# Patient Record
Sex: Female | Born: 1990
Health system: Southern US, Community
[De-identification: ages and names within clinical notes are randomized; demographics above are authoritative.]

## PROBLEM LIST (undated history)

## (undated) ENCOUNTER — Inpatient Hospital Stay (HOSPITAL_COMMUNITY): Payer: Self-pay

## (undated) DIAGNOSIS — I2699 Other pulmonary embolism without acute cor pulmonale: Secondary | ICD-10-CM

## (undated) DIAGNOSIS — Z789 Other specified health status: Secondary | ICD-10-CM

## (undated) HISTORY — PX: EYE SURGERY: SHX253

---
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
## 2012-11-19 DIAGNOSIS — H527 Unspecified disorder of refraction: Secondary | ICD-10-CM | POA: Insufficient documentation

## 2012-11-19 DIAGNOSIS — H5015 Alternating exotropia: Secondary | ICD-10-CM | POA: Insufficient documentation

## 2013-02-27 DIAGNOSIS — H53003 Unspecified amblyopia, bilateral: Secondary | ICD-10-CM | POA: Insufficient documentation

## 2013-10-10 ENCOUNTER — Emergency Department (HOSPITAL_BASED_OUTPATIENT_CLINIC_OR_DEPARTMENT_OTHER)
Admission: EM | Admit: 2013-10-10 | Discharge: 2013-10-10 | Disposition: A | Discharge status: Home / Self Care | Payer: BC Managed Care – PPO | Attending: Emergency Medicine | Admitting: Emergency Medicine

## 2013-10-10 ENCOUNTER — Encounter (HOSPITAL_BASED_OUTPATIENT_CLINIC_OR_DEPARTMENT_OTHER): Payer: Self-pay | Admitting: Emergency Medicine

## 2013-10-10 DIAGNOSIS — J029 Acute pharyngitis, unspecified: Secondary | ICD-10-CM | POA: Insufficient documentation

## 2013-10-10 DIAGNOSIS — R112 Nausea with vomiting, unspecified: Secondary | ICD-10-CM | POA: Diagnosis not present

## 2013-10-10 LAB — RAPID STREP SCREEN (MED CTR MEBANE ONLY): STREPTOCOCCUS, GROUP A SCREEN (DIRECT): NEGATIVE

## 2013-10-10 MED ORDER — ONDANSETRON HCL 4 MG/2ML IJ SOLN
4.0000 mg | Freq: Once | INTRAMUSCULAR | Status: AC
  Administered 2013-10-10: 4 mg via INTRAVENOUS
  Filled 2013-10-10: qty 2

## 2013-10-10 MED ORDER — OXYCODONE-ACETAMINOPHEN 5-325 MG PO TABS
1.0000 | ORAL_TABLET | Freq: Once | ORAL | Status: AC
  Administered 2013-10-10: 1 via ORAL
  Filled 2013-10-10: qty 1

## 2013-10-10 MED ORDER — DEXAMETHASONE 4 MG PO TABS: 10.0000 mg | ORAL_TABLET | Freq: Once | ORAL | Status: DC

## 2013-10-10 MED ORDER — DEXAMETHASONE 4 MG PO TABS
ORAL_TABLET | ORAL | Status: AC
  Filled 2013-10-10: qty 3

## 2013-10-10 MED ORDER — SODIUM CHLORIDE 0.9 % IV BOLUS (SEPSIS)
1000.0000 mL | Freq: Once | INTRAVENOUS | Status: AC
  Administered 2013-10-10: 1000 mL via INTRAVENOUS

## 2013-10-10 MED ORDER — DEXAMETHASONE 4 MG PO TABS
10.0000 mg | ORAL_TABLET | Freq: Once | ORAL | Status: AC
  Administered 2013-10-10: 10 mg via ORAL

## 2013-10-10 NOTE — Discharge Instructions (Signed)
Upper Respiratory Infection, Adult °An upper respiratory infection (URI) is also sometimes known as the common cold. The upper respiratory tract includes the nose, sinuses, throat, trachea, and bronchi. Bronchi are the airways leading to the lungs. Most people improve within 1 week, but symptoms can last up to 2 weeks. A residual cough may last even longer.  °CAUSES °Many different viruses can infect the tissues lining the upper respiratory tract. The tissues become irritated and inflamed and often become very moist. Mucus production is also common. A cold is contagious. You can easily spread the virus to others by oral contact. This includes kissing, sharing a glass, coughing, or sneezing. Touching your mouth or nose and then touching a surface, which is then touched by another person, can also spread the virus. °SYMPTOMS  °Symptoms typically develop 1 to 3 days after you come in contact with a cold virus. Symptoms vary from person to person. They may include: °· Runny nose. °· Sneezing. °· Nasal congestion. °· Sinus irritation. °· Sore throat. °· Loss of voice (laryngitis). °· Cough. °· Fatigue. °· Muscle aches. °· Loss of appetite. °· Headache. °· Low-grade fever. °DIAGNOSIS  °You might diagnose your own cold based on familiar symptoms, since most people get a cold 2 to 3 times a year. Your caregiver can confirm this based on your exam. Most importantly, your caregiver can check that your symptoms are not due to another disease such as strep throat, sinusitis, pneumonia, asthma, or epiglottitis. Blood tests, throat tests, and X-rays are not necessary to diagnose a common cold, but they may sometimes be helpful in excluding other more serious diseases. Your caregiver will decide if any further tests are required. °RISKS AND COMPLICATIONS  °You may be at risk for a more severe case of the common cold if you smoke cigarettes, have chronic heart disease (such as heart failure) or lung disease (such as asthma), or if  you have a weakened immune system. The very young and very old are also at risk for more serious infections. Bacterial sinusitis, middle ear infections, and bacterial pneumonia can complicate the common cold. The common cold can worsen asthma and chronic obstructive pulmonary disease (COPD). Sometimes, these complications can require emergency medical care and may be life-threatening. °PREVENTION  °The best way to protect against getting a cold is to practice good hygiene. Avoid oral or hand contact with people with cold symptoms. Wash your hands often if contact occurs. There is no clear evidence that vitamin C, vitamin E, echinacea, or exercise reduces the chance of developing a cold. However, it is always recommended to get plenty of rest and practice good nutrition. °TREATMENT  °Treatment is directed at relieving symptoms. There is no cure. Antibiotics are not effective, because the infection is caused by a virus, not by bacteria. Treatment may include: °· Increased fluid intake. Sports drinks offer valuable electrolytes, sugars, and fluids. °· Breathing heated mist or steam (vaporizer or shower). °· Eating chicken soup or other clear broths, and maintaining good nutrition. °· Getting plenty of rest. °· Using gargles or lozenges for comfort. °· Controlling fevers with ibuprofen or acetaminophen as directed by your caregiver. °· Increasing usage of your inhaler if you have asthma. °Zinc gel and zinc lozenges, taken in the first 24 hours of the common cold, can shorten the duration and lessen the severity of symptoms. Pain medicines may help with fever, muscle aches, and throat pain. A variety of non-prescription medicines are available to treat congestion and runny nose. Your caregiver   can make recommendations and may suggest nasal or lung inhalers for other symptoms.  °HOME CARE INSTRUCTIONS  °· Only take over-the-counter or prescription medicines for pain, discomfort, or fever as directed by your  caregiver. °· Use a warm mist humidifier or inhale steam from a shower to increase air moisture. This may keep secretions moist and make it easier to breathe. °· Drink enough water and fluids to keep your urine clear or pale yellow. °· Rest as needed. °· Return to work when your temperature has returned to normal or as your caregiver advises. You may need to stay home longer to avoid infecting others. You can also use a face mask and careful hand washing to prevent spread of the virus. °SEEK MEDICAL CARE IF:  °· After the first few days, you feel you are getting worse rather than better. °· You need your caregiver's advice about medicines to control symptoms. °· You develop chills, worsening shortness of breath, or brown or red sputum. These may be signs of pneumonia. °· You develop yellow or brown nasal discharge or pain in the face, especially when you bend forward. These may be signs of sinusitis. °· You develop a fever, swollen neck glands, pain with swallowing, or white areas in the back of your throat. These may be signs of strep throat. °SEEK IMMEDIATE MEDICAL CARE IF:  °· You have a fever. °· You develop severe or persistent headache, ear pain, sinus pain, or chest pain. °· You develop wheezing, a prolonged cough, cough up blood, or have a change in your usual mucus (if you have chronic lung disease). °· You develop sore muscles or a stiff neck. °Document Released: 08/02/2000 Document Revised: 05/01/2011 Document Reviewed: 05/14/2013 °ExitCare® Patient Information ©2015 [employer]. This information is not intended to replace advice given to you by your health care provider. Make sure you discuss any questions you have with your health care provider. ° °Emergency Department Resource Guide °1) Find a Doctor and Pay Out of Pocket °Although you won't have to find out who is covered by your insurance plan, it is a good idea to ask around and get recommendations. You will then need to call the office and see if  the doctor you have chosen will accept you as a new patient and what types of options they offer for patients who are self-pay. Some doctors offer discounts or will set up payment plans for their patients who do not have insurance, but you will need to ask so you aren't surprised when you get to your appointment. ° °2) Contact Your Local Health Department °Not all health departments have doctors that can see patients for sick visits, but many do, so it is worth a call to see if yours does. If you don't know where your local health department is, you can check in your phone book. The CDC also has a tool to help you locate your state's health department, and many state websites also have listings of all of their local health departments. ° °3) Find a Walk-in Clinic °If your illness is not likely to be very severe or complicated, you may want to try a walk in clinic. These are popping up all over the country in pharmacies, drugstores, and shopping centers. They're usually staffed by nurse practitioners or physician assistants that have been trained to treat common illnesses and complaints. They're usually fairly quick and inexpensive. However, if you have serious medical issues or chronic medical problems, these are probably not your best option. ° °  No Primary Care Doctor: °- Call [facility] at  832-8000 - they can help you locate a primary care doctor that  accepts your insurance, provides certain services, etc. °- Physician Referral Service- 1-800-533-3463 ° °Chronic Pain Problems: °Organization         Address  Phone   Notes  °[facility]  (336) 297-2271 Patients need to be referred by their primary care doctor.  ° °Medication Assistance: °Organization         Address  Phone   Notes  °[place] [facility] [address] °[place], [place] [address] (336) 641-8030 --Must be a resident of [place] °-- Must have NO insurance coverage whatsoever (no  Medicaid/ Medicare, etc.) °-- The pt. MUST have a primary care doctor that directs their care regularly and follows them in the community °  °[facility]  (866) 331-1348   °[employer]  (888) 892-1162   ° °Agencies that provide inexpensive medical care: °Organization         Address  Phone   Notes  °[facility]  (336) 832-8035   °[facility]    (336) 832-7272   °[facility] [address] °[place], [place] [address] (336) 832-4777   °[facility] [address], °[place] (336) 271-4999   °[facility]    (336) 373-0678   °[facility]    (336) 272-1050   °[facility] ° [address], [place] Phone:  (336) 832-4444, Fax:  (336) 832-4440 Hours of Operation:  9 am - 6 pm, M-F.  Also accepts Medicaid/Medicare and self-pay.  °[facility] ° [address], [place] Phone: (336) 832-3150, Fax: (336) 832-3151. Hours of Operation:  8:30 am - 5:30 pm, M-F.  Also accepts Medicaid and self-pay.  °[facility] [place] [address], [place] Phone: (336) 878-6027   °[facility] [address], [place], [place] (336)723-1848, Ext. 123 Mondays & Thursdays: 7-9 AM.  First 15 patients are seen on a first come, first serve basis. °  ° °Medicaid-accepting [place] Providers: ° °Organization         Address  Phone   Notes  °[facility] [address], [place] (336) 641-2100 Also accepts self-pay patients.  °[facility] [address], [place] ° (336) 856-9996   °[facility] [address], [place] (336) 288-8857   °[facility] [address] [place] [address], [place] (336) 299-7000   °Veita Bland [address], [place]  ° (336) 373-1557 Only accepts [place] Access Medicaid patients after they have their name applied to their card.   ° °Self-Pay (no insurance) in [place]: ° °Organization         Address  Phone   Notes  °Sickle Cell Patients, [facility] [address], [place] (336) 832-1970   °[facility] [address], [place] (336) 832-4400   ° [facility] ° [address], [place] (336) 992-4800   °[facility]/Dr. Osei-Bonsu ° [address] [place] [address], [place] or [address], [place] (336) 841-8500 Phone number for both [place] and [place] locations is the same.  °[facility] [address], [place] (336) 299-0000   °  [facility] [address] [place] [address], [place] or [address] (336) 852-7530 °(336) 878-2260   °[facility] [address], [place] (336) 350-1642, phone; (336) 294-5005, fax Sees patients 1st and 3rd Saturday of every month.  Must not qualify for public or private insurance (i.e. Medicaid, Medicare, [place] Health Choice, Veterans' Benefits) • Household income should be no more than 200% of the poverty level •The clinic cannot treat you if you are pregnant or think you are pregnant • Sexually transmitted diseases are not treated at the clinic.  ° ° °Dental Care: °Organization         Address  Phone  Notes  °[facility] [address], [place] (336) 641-6152 Accepts children up to age 21 who are enrolled in Medicaid or [place] Health Choice; pregnant women with a Medicaid card; and children who have applied for Medicaid or [place] Health Choice, but were declined, whose parents can pay a reduced fee at time of service.  °[facility] [place]  [address], [place] (336) 641-7733 Accepts children up to age 21 who are enrolled in Medicaid or [place] Health Choice; pregnant women with a Medicaid card; and children who have applied for Medicaid or [place] Health Choice,  but were declined, whose parents can pay a reduced fee at time of service.  °[facility] ° [address], [place] (336) 641-4533 Patients are seen by appointment only. Walk-ins are not accepted. [facility] will see patients 18 years of age and older. °Monday - Tuesday (8am-5pm) °Most Wednesdays (8:30-5pm) °$30 per visit, cash only  °[facility] ° [address], [place] (336) 641-4533 Patients are seen by appointment only. Walk-ins are not accepted. [facility] will see patients 18 years of age and older. °One Wednesday Evening (Monthly: Volunteer Based).  $30 per visit, cash only  °[facility]  (919) 537-3737 for adults; Children under age 4, call Graduate Pediatric Dentistry at (919) [phone]. Children aged 4-14, please call (919) [phone] to request a pediatric application. ° Dental services are provided in all areas of dental care including fillings, crowns and bridges, complete and partial dentures, implants, gum treatment, root canals, and extractions. Preventive care is also provided. Treatment is provided to both adults and children. °Patients are selected via a lottery and there is often a waiting list. °  °[facility] [address], °[place] ° (336) 763-8833 [email or web address] °  °[facility] [address], [place], [place] (336)723-1848, Ext. 123 Second and Fourth Thursday of each month, opens at 6:30 AM; Clinic ends at 9 AM.  Patients are seen on a first-come first-served basis, and a limited number are seen during each clinic.  ° °[facility] ° [address], [place],  (336) 723-7904   Eligibility Requirements °You must have lived in [place], [place], or [place] counties for at least the last three months. °  You cannot be eligible for state or federal sponsored healthcare insurance, including Veterans Administration, Medicaid, or Medicare. °  You generally  cannot be eligible for healthcare insurance through your employer.  °  How to apply: °Eligibility screenings are held every Tuesday and Wednesday afternoon from 1:00 pm until 4:00 pm. You do not need an appointment for the interview!  °[facility] [address], [place],  336-631-2330   °  [facility] [facility]  336-342-8273   °[facility]  336-703-3100   °[facility]  336-570-6415   ° °Behavioral Health Resources in the Community: °Intensive Outpatient Programs °Organization         Address  Phone  Notes  °[facility] [address], [place], [place] 336-878-6098   °[facility] [address], [place], [place] 336-832-9800   °[facility] [address], [place], [place] ° 336-882-2125   °[facility] [address],  °[place], [place] 1-800-853-5163 or 336-641-4981   °Substance Abuse Resources °Organization         Address  Phone  Notes  °[facility]  336-882-2125   °[facility]  336-784-9470   °[facility]  336-285-9073   °[facility]  336-845-3988   °Residential & Outpatient Substance Abuse Program  1-800-659-3381   °Psychological Services °Organization         Address  Phone  Notes  °[facility]  336- 832-9600   °[facility]  336- 378-7881   °[facility] [address], [place] 1-800-853-5163 or 336-641-4981   ° °Mobile Crisis Teams °Organization         Address  Phone  Notes  °[facility], Mobile Crisis Care Unit  1-877-626-1772   °Assertive °[facility] ° [address] [place], [place] 336-834-9664   °Sharon DeEsch [address] °[place] 336-554-5454   ° °Self-Help/Support Groups °Organization         Address  Phone             Notes  °[facility] - variety of support groups  336- 373-1402 Call for more  information  °Narcotics Anonymous (NA), [facility] [address], °[place]  2 meetings at this location  ° °Residential Treatment Programs °Organization         Address  Phone  Notes  °[facility] [address],    °[place]  1-866-801-8205   °[facility] ° [address], [place], [place] 704-293-8524   °[facility] [address], [place] 336-845-3988 Admissions: 8am-3pm M-F  °[facility] [address]    °[place], [place] 336-841-1104   °[facility] [address], [place], [place] 336-379-7146   °[facility] [address]  °[place], [place] 336-285-9073   °[facility] - Intensive Outpatient [address], [place], [place] 336-852-3033   °[facility] [address]  °[place], [place] 1-877-615-2722 or 336-784-9470   °[facility] [address] [place], [place] 336-227-7417 Accepts Medicaid  °[facility] [address]  °[place] 1-800-659-3381 Substance Abuse/Addiction Treatment  ° °[place] Behavioral Health Resources °Organization         Address  Phone  Notes  °[facility]  (888) 581-9988   °Julie Brannon, PhD [address] [place], [place]   (336) 349-5553 or (336) 951-0000   ° Behavioral   [address] °[place], [place] (336) 349-4454   °[facility] [address], [place], [place] (336) 342-8316 Insurance/Medicaid/sponsorship through [facility]  °[facility] [address]                                      [place], [place] (336) 342-8316 Therapy/tele-psych/case  °[facility] [address]  ° [place], [place] (336) 349-2233    °Dr. Arfeen  (336) 349-4544   °[facility] [place]  [employer] [facility] [place] [facility] 1) [address], [place] °2) [address], [place] °3)  [address], [place] (336)  349-3220 °(336) 342-7768 ° °(336) 342-8140   °[place] [facility] (336) 342-1394 or (336) 342-3537 (After Hours)    ° ° °

## 2013-10-10 NOTE — ED Provider Notes (Signed)
CSN: [id number]     Arrival date & time 10/10/13  0622 History   First MD Initiated Contact with Patient 10/10/13 [phone]     Chief Complaint  Patient presents with  . URI     (Consider location/radiation/quality/duration/timing/severity/associated sxs/prior Treatment) Patient is a 23 y.o. female presenting with general illness.  Illness Location:  Generalized, throat Quality:  Malaise, nausea, sore throat Severity:  Moderate Onset quality:  Gradual Duration:  1 day Timing:  Constant Progression:  Unchanged Chronicity:  New Context:  New job as a Radio producer by:  Nothing Worsened by:  Nothing Associated symptoms: congestion, cough (nonproductive), nausea and vomiting   Associated symptoms: no abdominal pain, no chest pain, no diarrhea, no fever, no rash, no rhinorrhea, no shortness of breath and no sore throat     History reviewed. No pertinent past medical history. Past Surgical History  Procedure Laterality Date  . Eye surgery     No family history on file. History  Substance Use Topics  . Smoking status: Never Smoker   . Smokeless tobacco: Not on file  . Alcohol Use: No   OB History   Grav Para Term Preterm Abortions TAB SAB Ect Mult Living                 Review of Systems  Constitutional: Negative for fever and chills.  HENT: Positive for congestion. Negative for rhinorrhea and sore throat.   Eyes: Negative for photophobia and visual disturbance.  Respiratory: Positive for cough (nonproductive). Negative for shortness of breath.   Cardiovascular: Negative for chest pain and leg swelling.  Gastrointestinal: Positive for nausea and vomiting. Negative for abdominal pain, diarrhea and constipation.  Endocrine: Negative for polyphagia and polyuria.  Genitourinary: Negative for dysuria, flank pain, vaginal bleeding, vaginal discharge and enuresis.  Musculoskeletal: Negative for back pain and gait problem.  Skin: Negative for color change and rash.  Neurological:  Negative for dizziness, syncope, light-headedness and numbness.  Hematological: Negative for adenopathy. Does not bruise/bleed easily.  All other systems reviewed and are negative.     Allergies  Review of patient's allergies indicates no known allergies.  Home Medications   Prior to Admission medications   Not on File   BP 126/58  Pulse 87  Temp(Src) 99 F (37.2 C) (Oral)  Resp 20  Ht 5\' 2"  (1.575 m)  Wt 170 lb (77.111 kg)  BMI 31.09 kg/m2  SpO2 100%  LMP 10/07/2013 Physical Exam  Vitals reviewed. Constitutional: She is oriented to person, place, and time. She appears well-developed and well-nourished.  HENT:  Head: Normocephalic and atraumatic.  Right Ear: External ear normal.  Left Ear: External ear normal.  Mouth/Throat: Posterior oropharyngeal edema (R>L) present. No posterior oropharyngeal erythema or tonsillar abscesses.  Eyes: Conjunctivae and EOM are normal. Pupils are equal, round, and reactive to light.  Neck: Normal range of motion. Neck supple.  Cardiovascular: Normal rate, regular rhythm, normal heart sounds and intact distal pulses.   Pulmonary/Chest: Effort normal and breath sounds normal.  Abdominal: Soft. Bowel sounds are normal. There is no tenderness.  Musculoskeletal: Normal range of motion.  Neurological: She is alert and oriented to person, place, and time.  Skin: Skin is warm and dry.    ED Course  Procedures (including critical care time) Labs Review Labs Reviewed  RAPID STREP SCREEN    Imaging Review No results found.   EKG Interpretation None      MDM   Final diagnoses:  Non-intractable vomiting with nausea, vomiting  of unspecified type  Sore throat    23 y.o. female  without pertinent PMH presents with signs symptoms consistent with viral URI. 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  Yesterday she developed a sore throat, nausea, vomiting, and dry cough.  On arrival vital signs and physical exam  as above, there is a small amount of tonsillar edema right greater than left, however the patient states this is her baseline. She is afebrile here.    Labs and imaging as above reviewed. Patient was given normal saline bolus with improvement in symptoms. She remained without vomiting throughout her stay. Patient was also given Decadron and Percocet for sore throat.  She complained of headache after which I performed a neurologic exam was unremarkable.  Since likely due to malaise from her viral syndrome. Patient was given standard return precautions for breast reductions, voiced understanding and will followup.    1. Non-intractable vomiting with nausea, vomiting of unspecified type   2. Sore throat         Mirian MoMatthew Gentry, MD 10/10/13 [phone]

## 2013-10-10 NOTE — ED Notes (Signed)
Pt c/o headache, sore throat, vomiting, nonproductive cough x1day

## 2013-10-12 LAB — CULTURE, GROUP A STREP

## 2013-12-12 ENCOUNTER — Encounter (HOSPITAL_COMMUNITY): Payer: Self-pay | Admitting: Emergency Medicine

## 2013-12-12 ENCOUNTER — Emergency Department (HOSPITAL_COMMUNITY)
Admission: EM | Admit: 2013-12-12 | Discharge: 2013-12-12 | Disposition: A | Discharge status: Home / Self Care | Payer: BC Managed Care – PPO | Source: Home / Self Care | Attending: Emergency Medicine | Admitting: Emergency Medicine

## 2013-12-12 DIAGNOSIS — L509 Urticaria, unspecified: Secondary | ICD-10-CM

## 2013-12-12 MED ORDER — FAMOTIDINE 40 MG PO TABS: 40.0000 mg | ORAL_TABLET | Freq: Every day | ORAL | Status: DC

## 2013-12-12 MED ORDER — CETIRIZINE HCL 10 MG PO TABS: 10.0000 mg | ORAL_TABLET | Freq: Two times a day (BID) | ORAL | Status: DC

## 2013-12-12 MED ORDER — PREDNISONE 10 MG PO TABS: ORAL_TABLET | ORAL | Status: DC

## 2013-12-12 MED ORDER — PREDNISONE 20 MG PO TABS
ORAL_TABLET | ORAL | Status: AC
  Filled 2013-12-12: qty 3

## 2013-12-12 MED ORDER — FAMOTIDINE 20 MG PO TABS
40.0000 mg | ORAL_TABLET | Freq: Once | ORAL | Status: AC
  Administered 2013-12-12: 40 mg via ORAL

## 2013-12-12 MED ORDER — DIPHENHYDRAMINE HCL 25 MG PO CAPS
ORAL_CAPSULE | ORAL | Status: AC
  Filled 2013-12-12: qty 2

## 2013-12-12 MED ORDER — PREDNISONE 20 MG PO TABS
60.0000 mg | ORAL_TABLET | Freq: Once | ORAL | Status: AC
  Administered 2013-12-12: 60 mg via ORAL

## 2013-12-12 MED ORDER — DIPHENHYDRAMINE HCL 25 MG PO CAPS
50.0000 mg | ORAL_CAPSULE | Freq: Once | ORAL | Status: AC
  Administered 2013-12-12: 50 mg via ORAL

## 2013-12-12 MED ORDER — FAMOTIDINE 20 MG PO TABS
ORAL_TABLET | ORAL | Status: AC
  Filled 2013-12-12: qty 2

## 2013-12-12 NOTE — ED Notes (Signed)
States she went to bed last evening felt fine no itching.  Woke up this am itching all over and both eyes are puffy and swollen... No difficulty breathing.  Airway is Patent.  VS are stable.

## 2013-12-12 NOTE — Discharge Instructions (Signed)
Hives Hives are itchy, red, swollen areas of the skin. They can vary in size and location on your body. Hives can come and go for hours or several days (acute hives) or for several weeks (chronic hives). Hives do not spread from person to person (noncontagious). They may get worse with scratching, exercise, and emotional stress. CAUSES   Allergic reaction to food, additives, or drugs.  Infections, including the common cold.  Illness, such as vasculitis, lupus, or thyroid disease.  Exposure to sunlight, heat, or cold.  Exercise.  Stress.  Contact with chemicals. SYMPTOMS   Red or white swollen patches on the skin. The patches may change size, shape, and location quickly and repeatedly.  Itching.  Swelling of the hands, feet, and face. This may occur if hives develop deeper in the skin. DIAGNOSIS  Your caregiver can usually tell what is wrong by performing a physical exam. Skin or blood tests may also be done to determine the cause of your hives. In some cases, the cause cannot be determined. TREATMENT  Mild cases usually get better with medicines such as antihistamines. Severe cases may require an emergency epinephrine injection. If the cause of your hives is known, treatment includes avoiding that trigger.  HOME CARE INSTRUCTIONS   Avoid causes that trigger your hives.  Take antihistamines as directed by your caregiver to reduce the severity of your hives. Non-sedating or low-sedating antihistamines are usually recommended. Do not drive while taking an antihistamine.  Take any other medicines prescribed for itching as directed by your caregiver.  Wear loose-fitting clothing.  Keep all follow-up appointments as directed by your caregiver. SEEK MEDICAL CARE IF:   You have persistent or severe itching that is not relieved with medicine.  You have painful or swollen joints. SEEK IMMEDIATE MEDICAL CARE IF:   You have a fever.  Your tongue or lips are swollen.  You have  trouble breathing or swallowing.  You feel tightness in the throat or chest.  You have abdominal pain. These problems may be the first sign of a life-threatening allergic reaction. Call your local emergency services (911 in [place] MAKE SURE YOU:   Understand these instructions.  Will watch your condition.  Will get help right away if you are not doing well or get worse. Document Released: 02/06/2005 Document Revised: 02/11/2013 Document Reviewed: 05/02/2011 Exit[employer] Patient Information 2015 [employer]. This information is not intended to replace advice given to you by your health care provider. Make sure you discuss any questions you have with your health care provider.  

## 2013-12-12 NOTE — ED Provider Notes (Signed)
CSN: [id number]     Arrival date & time 12/12/13  1914 History   First MD Initiated Contact with Patient 12/12/13 1920     Chief Complaint  Patient presents with  . Rash   (Consider location/radiation/quality/duration/timing/severity/associated sxs/prior Treatment) HPI        23 year old female presents for evaluation of a rash. This morning she woke up with puffiness of her eyes and itchy red rash on her forearms and lower back. She took Benadryl this morning which was not particularly helpful. She had some lip swelling earlier but that has resolved. No chest tightness, difficulty breathing, shortness of breath. No other systemic symptoms.  History reviewed. No pertinent past medical history. Past Surgical History  Procedure Laterality Date  . Eye surgery     No family history on file. History  Substance Use Topics  . Smoking status: Never Smoker   . Smokeless tobacco: Not on file  . Alcohol Use: No   OB History   Grav Para Term Preterm Abortions TAB SAB Ect Mult Living                 Review of Systems  Skin: Positive for rash.  All other systems reviewed and are negative.   Allergies  Review of patient's allergies indicates no known allergies.  Home Medications   Prior to Admission medications   Medication Sig Start Date End Date Taking? Authorizing Provider  cetirizine (ZYRTEC) 10 MG tablet Take 1 tablet (10 mg total) by mouth 2 (two) times daily. As needed for the rash 12/12/13   Graylon GoodZachary H Leannah Guse, PA-C  famotidine (PEPCID) 40 MG tablet Take 1 tablet (40 mg total) by mouth daily. 12/12/13   Adrian BlackwaterZachary H Coley Littles, PA-C  predniSONE (DELTASONE) 10 MG tablet 4 tabs PO QD for 4 days; 3 tabs PO QD for 3 days; 2 tabs PO QD for 2 days; 1 tab PO QD for 1 day 12/12/13   Graylon GoodZachary H Cadee Agro, PA-C   BP 125/80  Pulse 96  Temp(Src) 98.6 F (37 C) (Oral)  Resp 14  SpO2 99%  LMP 12/03/2013 Physical Exam  Nursing note and vitals reviewed. Constitutional: She is oriented to person, place,  and time. Vital signs are normal. She appears well-developed and well-nourished. No distress.  HENT:  Head: Normocephalic and atraumatic.  Mouth/Throat: No posterior oropharyngeal edema.  Tonsil stones on the right  Eyes: Conjunctivae are normal. Right eye exhibits no discharge. Left eye exhibits no discharge.  Neck: Normal range of motion. Neck supple. No tracheal deviation present.  Cardiovascular: Normal rate, regular rhythm and normal heart sounds.   Pulmonary/Chest: Effort normal and breath sounds normal. No respiratory distress. She has no wheezes. She has no rales.  Neurological: She is alert and oriented to person, place, and time. She has normal strength. Coordination normal.  Skin: Skin is warm and dry. No rash noted. She is not diaphoretic.  Psychiatric: She has a normal mood and affect. Judgment normal.    ED Course  Procedures (including critical care time) Labs Review Labs Reviewed - No data to display  Imaging Review No results found.   MDM   1. Urticaria    Urticaria, no signs of anaphylaxis. Vitals are normal, she is stable for discharge. She was given prednisone, Pepcid, and Benadryl here. She is discharged with prednisone, Pepcid, and Zyrtec twice a day. ER return precautions reviewed with the patient.   Meds ordered this encounter  Medications  . predniSONE (DELTASONE) tablet 60 mg  Sig:   . famotidine (PEPCID) tablet 40 mg    Sig:   . diphenhydrAMINE (BENADRYL) capsule 50 mg    Sig:   . predniSONE (DELTASONE) 10 MG tablet    Sig: 4 tabs PO QD for 4 days; 3 tabs PO QD for 3 days; 2 tabs PO QD for 2 days; 1 tab PO QD for 1 day    Dispense:  30 tablet    Refill:  0    Order Specific Question:  Supervising Provider    Answer:  Clementeen GrahamOREY, EVAN, S [[id number]]  . famotidine (PEPCID) 40 MG tablet    Sig: Take 1 tablet (40 mg total) by mouth daily.    Dispense:  10 tablet    Refill:  0    Order Specific Question:  Supervising Provider    Answer:  Clementeen GrahamOREY, EVAN, S  [id number]]  . cetirizine (ZYRTEC) 10 MG tablet    Sig: Take 1 tablet (10 mg total) by mouth 2 (two) times daily. As needed for the rash    Dispense:  20 tablet    Refill:  0    Order Specific Question:  Supervising Provider    Answer:  Clementeen GrahamOREY, EVAN, Kathie RhodesS [[id number]]       Graylon GoodZachary H Magalie Almon, PA-C 12/12/13 1949

## 2013-12-12 NOTE — ED Provider Notes (Signed)
Medical screening examination/treatment/procedure(s) were performed by non-physician practitioner and as supervising physician I was immediately available for consultation/collaboration.  Joao Mccurdy, M.D.  Tajanae Guilbault C Wakisha Alberts, MD 12/12/13 2029 

## 2014-01-20 ENCOUNTER — Encounter (HOSPITAL_COMMUNITY): Payer: Self-pay | Admitting: *Deleted

## 2014-01-20 ENCOUNTER — Inpatient Hospital Stay (HOSPITAL_COMMUNITY)
Admission: AD | Admit: 2014-01-20 | Discharge: 2014-01-21 | Disposition: A | Discharge status: Home / Self Care | Payer: BC Managed Care – PPO | Source: Ambulatory Visit | Attending: Obstetrics & Gynecology | Admitting: Obstetrics & Gynecology

## 2014-01-20 DIAGNOSIS — B9689 Other specified bacterial agents as the cause of diseases classified elsewhere: Secondary | ICD-10-CM | POA: Insufficient documentation

## 2014-01-20 DIAGNOSIS — N760 Acute vaginitis: Secondary | ICD-10-CM | POA: Insufficient documentation

## 2014-01-20 DIAGNOSIS — A084 Viral intestinal infection, unspecified: Secondary | ICD-10-CM | POA: Diagnosis not present

## 2014-01-20 DIAGNOSIS — K529 Noninfective gastroenteritis and colitis, unspecified: Secondary | ICD-10-CM

## 2014-01-20 DIAGNOSIS — R109 Unspecified abdominal pain: Secondary | ICD-10-CM | POA: Diagnosis present

## 2014-01-20 DIAGNOSIS — A499 Bacterial infection, unspecified: Secondary | ICD-10-CM | POA: Diagnosis not present

## 2014-01-20 DIAGNOSIS — N946 Dysmenorrhea, unspecified: Secondary | ICD-10-CM | POA: Diagnosis not present

## 2014-01-20 HISTORY — DX: Other specified health status: Z78.9

## 2014-01-20 LAB — POCT PREGNANCY, URINE: PREG TEST UR: NEGATIVE

## 2014-01-20 NOTE — MAU Note (Signed)
C/o abd cramping on and off x 4 days with some spotting> Negative pregnancy test last week. Has not had a period in over a month LMP 01/19/14

## 2014-01-20 NOTE — MAU Note (Signed)
Pt states "my stomach is really really cramping and I aint had a period in a month" 12/19/2013.

## 2014-01-21 DIAGNOSIS — N946 Dysmenorrhea, unspecified: Secondary | ICD-10-CM

## 2014-01-21 DIAGNOSIS — K529 Noninfective gastroenteritis and colitis, unspecified: Secondary | ICD-10-CM

## 2014-01-21 DIAGNOSIS — A499 Bacterial infection, unspecified: Secondary | ICD-10-CM

## 2014-01-21 DIAGNOSIS — N760 Acute vaginitis: Secondary | ICD-10-CM | POA: Diagnosis not present

## 2014-01-21 LAB — CBC WITH DIFFERENTIAL/PLATELET
BASOS ABS: 0 10*3/uL (ref 0.0–0.1)
Basophils Relative: 1 % (ref 0–1)
Eosinophils Absolute: 0.1 10*3/uL (ref 0.0–0.7)
Eosinophils Relative: 1 % (ref 0–5)
HCT: 35.3 % — ABNORMAL LOW (ref 36.0–46.0)
Hemoglobin: 10 g/dL — ABNORMAL LOW (ref 12.0–15.0)
Lymphocytes Relative: 28 % (ref 12–46)
Lymphs Abs: 2.5 10*3/uL (ref 0.7–4.0)
MCH: 19.1 pg — ABNORMAL LOW (ref 26.0–34.0)
MCHC: 28.3 g/dL — ABNORMAL LOW (ref 30.0–36.0)
MCV: 67.5 fL — ABNORMAL LOW (ref 78.0–100.0)
Monocytes Absolute: 0.9 10*3/uL (ref 0.1–1.0)
Monocytes Relative: 10 % (ref 3–12)
NEUTROS ABS: 5.3 10*3/uL (ref 1.7–7.7)
NEUTROS PCT: 60 % (ref 43–77)
Platelets: 282 10*3/uL (ref 150–400)
RBC: 5.23 MIL/uL — ABNORMAL HIGH (ref 3.87–5.11)
RDW: 18.7 % — AB (ref 11.5–15.5)
WBC: 8.8 10*3/uL (ref 4.0–10.5)

## 2014-01-21 LAB — GC/CHLAMYDIA PROBE AMP
CT Probe RNA: POSITIVE — AB
GC PROBE AMP APTIMA: NEGATIVE

## 2014-01-21 LAB — URINE MICROSCOPIC-ADD ON

## 2014-01-21 LAB — COMPREHENSIVE METABOLIC PANEL
ALBUMIN: 3.9 g/dL (ref 3.5–5.2)
ALK PHOS: 81 U/L (ref 39–117)
ALT: 9 U/L (ref 0–35)
AST: 12 U/L (ref 0–37)
Anion gap: 15 (ref 5–15)
BUN: 8 mg/dL (ref 6–23)
CO2: 23 mEq/L (ref 19–32)
CREATININE: 0.66 mg/dL (ref 0.50–1.10)
Calcium: 9.2 mg/dL (ref 8.4–10.5)
Chloride: 101 mEq/L (ref 96–112)
GFR calc Af Amer: >90 mL/min (ref >90)
GFR calc non Af Amer: >90 mL/min (ref >90)
Glucose, Bld: 84 mg/dL (ref 70–99)
POTASSIUM: 3.4 meq/L — AB (ref 3.7–5.3)
Sodium: 139 mEq/L (ref 137–147)
Total Bilirubin: 0.3 mg/dL (ref 0.3–1.2)
Total Protein: 7.5 g/dL (ref 6.0–8.3)

## 2014-01-21 LAB — URINALYSIS, ROUTINE W REFLEX MICROSCOPIC
Bilirubin Urine: NEGATIVE
GLUCOSE, UA: NEGATIVE mg/dL
Ketones, ur: 15 mg/dL — AB
LEUKOCYTES UA: NEGATIVE
NITRITE: NEGATIVE
PROTEIN: NEGATIVE mg/dL
Specific Gravity, Urine: >1.03 — ABNORMAL HIGH (ref 1.005–1.030)
Urobilinogen, UA: 0.2 mg/dL (ref 0.0–1.0)
pH: 5.5 (ref 5.0–8.0)

## 2014-01-21 LAB — WET PREP, GENITAL
Trich, Wet Prep: NONE SEEN
Yeast Wet Prep HPF POC: NONE SEEN

## 2014-01-21 LAB — HIV ANTIBODY (ROUTINE TESTING W REFLEX): HIV: NONREACTIVE

## 2014-01-21 MED ORDER — POTASSIUM CHLORIDE CRYS ER 20 MEQ PO TBCR
20.0000 meq | EXTENDED_RELEASE_TABLET | Freq: Once | ORAL | Status: AC
  Administered 2014-01-21: 20 meq via ORAL
  Filled 2014-01-21: qty 1

## 2014-01-21 MED ORDER — METRONIDAZOLE 500 MG PO TABS: 500.0000 mg | ORAL_TABLET | Freq: Two times a day (BID) | ORAL | Status: DC

## 2014-01-21 NOTE — Discharge Instructions (Signed)
Bacterial Vaginosis Bacterial vaginosis is an infection of the vagina. It happens when too many of certain germs (bacteria) grow in the vagina. HOME CARE  Take your medicine as told by your doctor.  Finish your medicine even if you start to feel better.  Do not have sex until you finish your medicine and are better.  Tell your sex partner that you have an infection. They should see their doctor for treatment.  Practice safe sex. Use condoms. Have only one sex partner. GET HELP IF:  You are not getting better after 3 days of treatment.  You have more grey fluid (discharge) coming from your vagina than before.  You have more pain than before.  You have a fever. MAKE SURE YOU:   Understand these instructions.  Will watch your condition.  Will get help right away if you are not doing well or get worse. Document Released: 11/16/2007 Document Revised: 11/27/2012 Document Reviewed: 09/18/2012 [facility]Exit[facility] Patient Information 2015 [place], [place]. This information is not intended to replace advice given to you by your health care provider. Make sure you discuss any questions you have with your health care provider. Dysmenorrhea Dysmenorrhea is pain during a menstrual period. You will have pain in the lower belly (abdomen). The pain is caused by the tightening (contracting) of the muscles of the uterus. The pain can be minor or severe. Headache, feeling sick to your stomach (nausea), throwing up (vomiting), or low back pain may occur with this condition. HOME CARE  Only take medicine as told by your doctor.  Place a heating pad or hot water bottle on your lower back or belly. Do not sleep with a heating pad.  Exercise may help lessen the pain.  Massage the lower back or belly.  Stop smoking.  Avoid alcohol and caffeine. GET HELP IF:   Your pain does not get better with medicine.  You have pain during sex.  Your pain gets worse while taking pain medicine.  Your period bleeding  is heavier than normal.  You keep feeling sick to your stomach or keep throwing up. GET HELP RIGHT AWAY IF: You pass out (faint). Document Released: 05/05/2008 Document Revised: 02/11/2013 Document Reviewed: 07/25/2012 [facility] CenterExitCare Patient Information 2015 [place]ExitCare, [place]LLC. This information is not intended to replace advice given to you by your health care provider. Make sure you discuss any questions you have with your health care provider. Viral Gastroenteritis Viral gastroenteritis is also known as stomach flu. This condition affects the stomach and intestinal tract. It can cause sudden diarrhea and vomiting. The illness typically lasts 3 to 8 days. Most people develop an immune response that eventually gets rid of the virus. While this natural response develops, the virus can make you quite ill. CAUSES  Many different viruses can cause gastroenteritis, such as rotavirus or noroviruses. You can catch one of these viruses by consuming contaminated food or water. You may also catch a virus by sharing utensils or other personal items with an infected person or by touching a contaminated surface. SYMPTOMS  The most common symptoms are diarrhea and vomiting. These problems can cause a severe loss of body fluids (dehydration) and a body salt (electrolyte) imbalance. Other symptoms may include:  Fever.  Headache.  Fatigue.  Abdominal pain. DIAGNOSIS  Your caregiver can usually diagnose viral gastroenteritis based on your symptoms and a physical exam. A stool sample may also be taken to test for the presence of viruses or other infections. TREATMENT  This illness typically goes away on  its own. Treatments are aimed at rehydration. The most serious cases of viral gastroenteritis involve vomiting so severely that you are not able to keep fluids down. In these cases, fluids must be given through an intravenous line (IV). HOME CARE INSTRUCTIONS   Drink enough fluids to keep your urine clear or pale  yellow. Drink small amounts of fluids frequently and increase the amounts as tolerated.  Ask your caregiver for specific rehydration instructions.  Avoid:  Foods high in sugar.  Alcohol.  Carbonated drinks.  Tobacco.  Juice.  Caffeine drinks.  Extremely hot or cold fluids.  Fatty, greasy foods.  Too much intake of anything at one time.  Dairy products until 24 to 48 hours after diarrhea stops.  You may consume probiotics. Probiotics are active cultures of beneficial bacteria. They may lessen the amount and number of diarrheal stools in adults. Probiotics can be found in yogurt with active cultures and in supplements.  Wash your hands well to avoid spreading the virus.  Only take over-the-counter or prescription medicines for pain, discomfort, or fever as directed by your caregiver. Do not give aspirin to children. Antidiarrheal medicines are not recommended.  Ask your caregiver if you should continue to take your regular prescribed and over-the-counter medicines.  Keep all follow-up appointments as directed by your caregiver. SEEK IMMEDIATE MEDICAL CARE IF:   You are unable to keep fluids down.  You do not urinate at least once every 6 to 8 hours.  You develop shortness of breath.  You notice blood in your stool or vomit. This may look like coffee grounds.  You have abdominal pain that increases or is concentrated in one small area (localized).  You have persistent vomiting or diarrhea.  You have a fever.  The patient is a child younger than 3 months, and he or she has a fever.  The patient is a child older than 3 months, and he or she has a fever and persistent symptoms.  The patient is a child older than 3 months, and he or she has a fever and symptoms suddenly get worse.  The patient is a baby, and he or she has no tears when crying. MAKE SURE YOU:   Understand these instructions.  Will watch your condition.  Will get help right away if you are not  doing well or get worse. Document Released: 02/06/2005 Document Revised: 05/01/2011 Document Reviewed: 11/23/2010 [facility] DiseaseExitCare Patient Information 2015 [place]ExitCare, [place]LLC. This information is not intended to replace advice given to you by your health care provider. Make sure you discuss any questions you have with your health care provider.

## 2014-01-21 NOTE — MAU Provider Note (Signed)
History     CSN: [id number]  Arrival date and time: 01/20/14 2338   First Provider Initiated Contact with Patient 01/21/14 0017      Chief Complaint  Patient presents with  . Abdominal Pain   HPI  Ms. Vicki Mays is a 23 y.o. [id number] who presents to MAU today with complaint of late period and lower abdominal cramping. The patient states that she is sexually active and only uses condoms sometimes, she is not on birth control. She denies vaginal discharge, but has noted spotting today. She denies a history of irregular periods. She had had some nausea without vomiting. She denies fever. She also endorses a 2 week history of loose stools up to 7 times/day. She has not had this evaluated yet.   OB History    Gravida Para Term Preterm AB TAB SAB Ectopic Multiple Living   0 0 0 0 0 0 0 0 0 0       Past Medical History  Diagnosis Date  . Medical history non-contributory     Past Surgical History  Procedure Laterality Date  . Eye surgery      History reviewed. No pertinent family history.  History  Substance Use Topics  . Smoking status: Never Smoker   . Smokeless tobacco: Not on file  . Alcohol Use: No    Allergies: No Known Allergies  Prescriptions prior to admission  Medication Sig Dispense Refill Last Dose  . cetirizine (ZYRTEC) 10 MG tablet Take 1 tablet (10 mg total) by mouth 2 (two) times daily. As needed for the rash 20 tablet 0   . famotidine (PEPCID) 40 MG tablet Take 1 tablet (40 mg total) by mouth daily. 10 tablet 0   . predniSONE (DELTASONE) 10 MG tablet 4 tabs PO QD for 4 days; 3 tabs PO QD for 3 days; 2 tabs PO QD for 2 days; 1 tab PO QD for 1 day 30 tablet 0     Review of Systems  Constitutional: Negative for fever and malaise/fatigue.  Gastrointestinal: Positive for nausea, abdominal pain and diarrhea. Negative for vomiting and constipation.  Genitourinary: Negative for dysuria, urgency and frequency.       + vaginal discharge, spotting   Physical  Exam   Blood pressure 139/86, pulse 103, temperature 98.1 F (36.7 C), resp. rate 18, height 5\' 3"  (1.6 m), weight 230 lb 12.8 oz (104.69 kg), last menstrual period 12/19/2013.  Physical Exam  Constitutional: She is oriented to person, place, and time. She appears well-developed and well-nourished. No distress.  HENT:  Head: Normocephalic.  Cardiovascular: Normal rate.   Respiratory: Effort normal.  GI: Soft. Bowel sounds are normal. She exhibits no distension and no mass. There is no tenderness. There is no rebound and no guarding.  Neurological: She is alert and oriented to person, place, and time.  Skin: Skin is warm and dry. No erythema.  Psychiatric: She has a normal mood and affect.   Results for orders placed or performed during the hospital encounter of 01/20/14 (from the past 24 hour(s))  Urinalysis, Routine w reflex microscopic     Status: Abnormal   Collection Time: 01/20/14 11:40 PM  Result Value Ref Range   Color, Urine YELLOW YELLOW   APPearance CLEAR CLEAR   Specific Gravity, Urine >1.030 (H) 1.005 - 1.030   pH 5.5 5.0 - 8.0   Glucose, UA NEGATIVE NEGATIVE mg/dL   Hgb urine dipstick LARGE (A) NEGATIVE   Bilirubin Urine NEGATIVE NEGATIVE   Ketones, ur 15 (  A) NEGATIVE mg/dL   Protein, ur NEGATIVE NEGATIVE mg/dL   Urobilinogen, UA 0.2 0.0 - 1.0 mg/dL   Nitrite NEGATIVE NEGATIVE   Leukocytes, UA NEGATIVE NEGATIVE  Urine microscopic-add on     Status: Abnormal   Collection Time: 01/20/14 11:40 PM  Result Value Ref Range   Squamous Epithelial / LPF RARE RARE   WBC, UA 3-6 <3 WBC/hpf   RBC / HPF 7-10 <3 RBC/hpf   Bacteria, UA FEW (A) RARE   Urine-Other MUCOUS PRESENT   Pregnancy, urine POC     Status: None   Collection Time: 01/20/14 11:53 PM  Result Value Ref Range   Preg Test, Ur NEGATIVE NEGATIVE  Wet prep, genital     Status: Abnormal   Collection Time: 01/21/14 12:30 AM  Result Value Ref Range   Yeast Wet Prep HPF POC NONE SEEN NONE SEEN   Trich, Wet Prep  NONE SEEN NONE SEEN   Clue Cells Wet Prep HPF POC MODERATE (A) NONE SEEN   WBC, Wet Prep HPF POC FEW (A) NONE SEEN  CBC with Differential     Status: Abnormal   Collection Time: 01/21/14 12:30 AM  Result Value Ref Range   WBC 8.8 4.0 - 10.5 K/uL   RBC 5.23 (H) 3.87 - 5.11 MIL/uL   Hemoglobin 10.0 (L) 12.0 - 15.0 g/dL   HCT 60.435.3 (L) 54.036.0 - 98.146.0 %   MCV 67.5 (L) 78.0 - 100.0 fL   MCH 19.1 (L) 26.0 - 34.0 pg   MCHC 28.3 (L) 30.0 - 36.0 g/dL   RDW 19.118.7 (H) 47.811.5 - 29.515.5 %   Platelets 282 150 - 400 K/uL   Neutrophils Relative % 60 43 - 77 %   Neutro Abs 5.3 1.7 - 7.7 K/uL   Lymphocytes Relative 28 12 - 46 %   Lymphs Abs 2.5 0.7 - 4.0 K/uL   Monocytes Relative 10 3 - 12 %   Monocytes Absolute 0.9 0.1 - 1.0 K/uL   Eosinophils Relative 1 0 - 5 %   Eosinophils Absolute 0.1 0.0 - 0.7 K/uL   Basophils Relative 1 0 - 1 %   Basophils Absolute 0.0 0.0 - 0.1 K/uL  Comprehensive metabolic panel     Status: Abnormal   Collection Time: 01/21/14 12:30 AM  Result Value Ref Range   Sodium 139 137 - 147 mEq/L   Potassium 3.4 (L) 3.7 - 5.3 mEq/L   Chloride 101 96 - 112 mEq/L   CO2 23 19 - 32 mEq/L   Glucose, Bld 84 70 - 99 mg/dL   BUN 8 6 - 23 mg/dL   Creatinine, Ser 6.210.66 0.50 - 1.10 mg/dL   Calcium 9.2 8.4 - 30.810.5 mg/dL   Total Protein 7.5 6.0 - 8.3 g/dL   Albumin 3.9 3.5 - 5.2 g/dL   AST 12 0 - 37 U/L   ALT 9 0 - 35 U/L   Alkaline Phosphatase 81 39 - 117 U/L   Total Bilirubin 0.3 0.3 - 1.2 mg/dL   GFR calc non Af Amer >90 >90 mL/min   GFR calc Af Amer >90 >90 mL/min   Anion gap 15 5 - 15    MAU Course  Procedures None  MDM UPT - negative UA, wet prep, GC/chlamydia, CBC and CMP today 20 mEq K-Dur given in MAU Discussed with patient need to increased potassium in diet and that this will most likely resolve once diarrhea has stopped Assessment and Plan  A: Viral gastroenteritis Bacterial vaginosis Dysmenorrhea  P: Discharge home Rx for Flagyl given to patient Patient advised to  try immodium for diarrhea Patient encouraged to use condoms with every intercourse to avoid pregnancy. Contact information for [facility] and [facility] given if birth control is desired Patient may return to MAU as needed or if her condition were to change or worsen   Marny Lowenstein, PA-C  01/21/2014, 1:43 AM

## 2014-02-05 ENCOUNTER — Telehealth: Payer: Self-pay | Admitting: Physician Assistant

## 2014-02-05 MED ORDER — AZITHROMYCIN 250 MG PO TABS: 1000.0000 mg | ORAL_TABLET | Freq: Once | ORAL | Status: DC

## 2014-02-05 NOTE — Telephone Encounter (Signed)
Brandy from health department has called about this pt, Vicki Mays, whom was seen in MAU on 01/21/14.  Her chlamydia test later resulted as positive.  No rx yet provided.  [facility] [place] [facility] [place] [facility] [place].  Azithromycin rx to be given.  Gearldine BienenstockBrandy has pt with her and she is notified of the rx being available.    [facility]

## 2014-08-13 ENCOUNTER — Encounter (HOSPITAL_COMMUNITY): Payer: Self-pay

## 2014-08-13 ENCOUNTER — Inpatient Hospital Stay (HOSPITAL_COMMUNITY)
Admission: AD | Admit: 2014-08-13 | Discharge: 2014-08-14 | Disposition: A | Discharge status: Home / Self Care | Payer: BLUE CROSS/BLUE SHIELD | Source: Ambulatory Visit | Attending: Family Medicine | Admitting: Family Medicine

## 2014-08-13 ENCOUNTER — Inpatient Hospital Stay (HOSPITAL_COMMUNITY): Payer: BLUE CROSS/BLUE SHIELD

## 2014-08-13 DIAGNOSIS — O26899 Other specified pregnancy related conditions, unspecified trimester: Secondary | ICD-10-CM

## 2014-08-13 DIAGNOSIS — Z3A25 25 weeks gestation of pregnancy: Secondary | ICD-10-CM | POA: Insufficient documentation

## 2014-08-13 DIAGNOSIS — Z3689 Encounter for other specified antenatal screening: Secondary | ICD-10-CM | POA: Insufficient documentation

## 2014-08-13 DIAGNOSIS — R109 Unspecified abdominal pain: Secondary | ICD-10-CM | POA: Diagnosis present

## 2014-08-13 DIAGNOSIS — O9989 Other specified diseases and conditions complicating pregnancy, childbirth and the puerperium: Secondary | ICD-10-CM | POA: Diagnosis not present

## 2014-08-13 LAB — URINALYSIS, ROUTINE W REFLEX MICROSCOPIC
Bilirubin Urine: NEGATIVE
Glucose, UA: NEGATIVE mg/dL
Hgb urine dipstick: NEGATIVE
Ketones, ur: NEGATIVE mg/dL
Nitrite: NEGATIVE
Protein, ur: NEGATIVE mg/dL
Specific Gravity, Urine: <1.005 — ABNORMAL LOW (ref 1.005–1.030)
Urobilinogen, UA: 0.2 mg/dL (ref 0.0–1.0)
pH: 6.5 (ref 5.0–8.0)

## 2014-08-13 LAB — HCG, QUANTITATIVE, PREGNANCY: hCG, Beta Chain, Quant, S: 6443 m[IU]/mL — ABNORMAL HIGH (ref <5)

## 2014-08-13 LAB — CBC
HCT: 31 % — ABNORMAL LOW (ref 36.0–46.0)
Hemoglobin: 9.3 g/dL — ABNORMAL LOW (ref 12.0–15.0)
MCH: 19.6 pg — ABNORMAL LOW (ref 26.0–34.0)
MCHC: 30 g/dL (ref 30.0–36.0)
MCV: 65.4 fL — ABNORMAL LOW (ref 78.0–100.0)
Platelets: 249 10*3/uL (ref 150–400)
RBC: 4.74 MIL/uL (ref 3.87–5.11)
RDW: 18.7 % — ABNORMAL HIGH (ref 11.5–15.5)
WBC: 12 10*3/uL — ABNORMAL HIGH (ref 4.0–10.5)

## 2014-08-13 LAB — POCT PREGNANCY, URINE: Preg Test, Ur: POSITIVE — AB

## 2014-08-13 LAB — URINE MICROSCOPIC-ADD ON

## 2014-08-13 NOTE — MAU Provider Note (Signed)
History     CSN: [id number]  Arrival date and time: 08/13/14 2146   First Provider Initiated Contact with Patient 08/13/14 2332      Chief Complaint  Patient presents with  . Possible Pregnancy  . Abdominal Pain   HPI 24 y.o. G1P0000 @ [redacted]w[redacted]d presents to the MAU stating that she is having stomach pain in her left lower side of her abdomen. Denies any bleeding. Just took home pregnancy test 2 days ago and it was positive.  Past Medical History  Diagnosis Date  . Medical history non-contributory     Past Surgical History  Procedure Laterality Date  . Eye surgery      History reviewed. No pertinent family history.  History  Substance Use Topics  . Smoking status: Never Smoker   . Smokeless tobacco: Not on file  . Alcohol Use: No    Allergies: No Known Allergies  No prescriptions prior to admission    Review of Systems  Gastrointestinal: Positive for abdominal pain. Negative for nausea and vomiting.  All other systems reviewed and are negative.  Physical Exam   Blood pressure 143/99, pulse 118, temperature 98.5 F (36.9 C), temperature source Oral, resp. rate 18, height  (1.6 m), weight 108.319 kg (238 lb 12.8 oz), last menstrual period 06/03/2014, SpO2 100 %.    Results for orders placed or performed during the hospital encounter of 08/13/14 (from the past 24 hour(s))  Urinalysis, Routine w reflex microscopic (not at [facility])     Status: Abnormal   Collection Time: 08/13/14 10:15 PM  Result Value Ref Range   Color, Urine YELLOW YELLOW   APPearance CLEAR CLEAR   Specific Gravity, Urine <1.005 (L) 1.005 - 1.030   pH 6.5 5.0 - 8.0   Glucose, UA NEGATIVE NEGATIVE mg/dL   Hgb urine dipstick NEGATIVE NEGATIVE   Bilirubin Urine NEGATIVE NEGATIVE   Ketones, ur NEGATIVE NEGATIVE mg/dL   Protein, ur NEGATIVE NEGATIVE mg/dL   Urobilinogen, UA 0.2 0.0 - 1.0 mg/dL   Nitrite NEGATIVE NEGATIVE   Leukocytes, UA SMALL (A) NEGATIVE  Urine microscopic-add on     Status:  None   Collection Time: 08/13/14 10:15 PM  Result Value Ref Range   Squamous Epithelial / LPF RARE RARE   WBC, UA 3-6 <3 WBC/hpf   Bacteria, UA RARE RARE   Urine-Other MUCOUS PRESENT   Pregnancy, urine POC     Status: Abnormal   Collection Time: 08/13/14 10:21 PM  Result Value Ref Range   Preg Test, Ur POSITIVE (A) NEGATIVE  CBC     Status: Abnormal   Collection Time: 08/13/14 10:39 PM  Result Value Ref Range   WBC 12.0 (H) 4.0 - 10.5 K/uL   RBC 4.74 3.87 - 5.11 MIL/uL   Hemoglobin 9.3 (L) 12.0 - 15.0 g/dL   HCT 44.0 (L) 10.2 - 72.5 %   MCV 65.4 (L) 78.0 - 100.0 fL   MCH 19.6 (L) 26.0 - 34.0 pg   MCHC 30.0 30.0 - 36.0 g/dL   RDW 36.6 (H) 44.0 - 34.7 %   Platelets 249 150 - 400 K/uL  hCG, quantitative, pregnancy     Status: Abnormal   Collection Time: 08/13/14 10:39 PM  Result Value Ref Range   hCG, Beta Chain, Quant, S 6443 (H) <5 mIU/mL  ABO/Rh     Status: None (Preliminary result)   Collection Time: 08/13/14 10:39 PM  Result Value Ref Range   ABO/RH(D) O POS   Dilation: Closed Exam by::  Illene Bolus CNM  Results for orders placed or performed during the hospital encounter of 08/13/14 (from the past 24 hour(s))  Protein / creatinine ratio, urine     Status: None   Collection Time: 08/13/14 10:02 PM  Result Value Ref Range   Creatinine, Urine 65.00 mg/dL   Total Protein, Urine <6 mg/dL   Protein Creatinine Ratio        0.00 - 0.15 mg/mg[Cre]  Urinalysis, Routine w reflex microscopic (not at [facility])     Status: Abnormal   Collection Time: 08/13/14 10:15 PM  Result Value Ref Range   Color, Urine YELLOW YELLOW   APPearance CLEAR CLEAR   Specific Gravity, Urine <1.005 (L) 1.005 - 1.030   pH 6.5 5.0 - 8.0   Glucose, UA NEGATIVE NEGATIVE mg/dL   Hgb urine dipstick NEGATIVE NEGATIVE   Bilirubin Urine NEGATIVE NEGATIVE   Ketones, ur NEGATIVE NEGATIVE mg/dL   Protein, ur NEGATIVE NEGATIVE mg/dL   Urobilinogen, UA 0.2 0.0 - 1.0 mg/dL   Nitrite NEGATIVE NEGATIVE    Leukocytes, UA SMALL (A) NEGATIVE  Urine microscopic-add on     Status: None   Collection Time: 08/13/14 10:15 PM  Result Value Ref Range   Squamous Epithelial / LPF RARE RARE   WBC, UA 3-6 <3 WBC/hpf   Bacteria, UA RARE RARE   Urine-Other MUCOUS PRESENT   Pregnancy, urine POC     Status: Abnormal   Collection Time: 08/13/14 10:21 PM  Result Value Ref Range   Preg Test, Ur POSITIVE (A) NEGATIVE  CBC     Status: Abnormal   Collection Time: 08/13/14 10:39 PM  Result Value Ref Range   WBC 12.0 (H) 4.0 - 10.5 K/uL   RBC 4.74 3.87 - 5.11 MIL/uL   Hemoglobin 9.3 (L) 12.0 - 15.0 g/dL   HCT 11.9 (L) 14.7 - 82.9 %   MCV 65.4 (L) 78.0 - 100.0 fL   MCH 19.6 (L) 26.0 - 34.0 pg   MCHC 30.0 30.0 - 36.0 g/dL   RDW 56.2 (H) 13.0 - 86.5 %   Platelets 249 150 - 400 K/uL  hCG, quantitative, pregnancy     Status: Abnormal   Collection Time: 08/13/14 10:39 PM  Result Value Ref Range   hCG, Beta Chain, Quant, S 6443 (H) <5 mIU/mL  ABO/Rh     Status: None (Preliminary result)   Collection Time: 08/13/14 10:39 PM  Result Value Ref Range   ABO/RH(D) O POS   Differential     Status: Abnormal   Collection Time: 08/13/14 10:39 PM  Result Value Ref Range   Neutrophils Relative % 64 43 - 77 %   Neutro Abs 7.7 1.7 - 7.7 K/uL   Lymphocytes Relative 23 12 - 46 %   Lymphs Abs 2.7 0.7 - 4.0 K/uL   Monocytes Relative 12 3 - 12 %   Monocytes Absolute 1.4 (H) 0.1 - 1.0 K/uL   Eosinophils Relative 1 0 - 5 %   Eosinophils Absolute 0.1 0.0 - 0.7 K/uL   Basophils Relative 0 0 - 1 %   Basophils Absolute 0.0 0.0 - 0.1 K/uL  Type and screen     Status: None   Collection Time: 08/14/14  1:34 AM  Result Value Ref Range   ABO/RH(D) O POS    Antibody Screen NEG    Sample Expiration 08/17/2014   Comprehensive metabolic panel     Status: Abnormal   Collection Time: 08/14/14  1:34 AM  Result Value Ref Range  Sodium 135 135 - 145 mmol/L   Potassium 3.6 3.5 - 5.1 mmol/L   Chloride 110 101 - 111 mmol/L   CO2 21  (L) 22 - 32 mmol/L   Glucose, Bld 89 65 - 99 mg/dL   BUN 7 6 - 20 mg/dL   Creatinine, Ser 4.13 0.44 - 1.00 mg/dL   Calcium 8.6 (L) 8.9 - 10.3 mg/dL   Total Protein 6.4 (L) 6.5 - 8.1 g/dL   Albumin 2.7 (L) 3.5 - 5.0 g/dL   AST 14 (L) 15 - 41 U/L   ALT 10 (L) 14 - 54 U/L   Alkaline Phosphatase 99 38 - 126 U/L   Total Bilirubin 0.4 0.3 - 1.2 mg/dL   GFR calc non Af Amer >60 >60 mL/min   GFR calc Af Amer >60 >60 mL/min   Anion gap 4 (L) 5 - 15   Physical Exam  Nursing note and vitals reviewed. Constitutional: She is oriented to person, place, and time.  Neck: Normal range of motion.  Cardiovascular: Normal rate.   Respiratory: Effort normal. No respiratory distress.  GI: Soft. There is no tenderness.  Musculoskeletal: Normal range of motion. She exhibits no edema.  Neurological: She is alert and oriented to person, place, and time.  Skin: Skin is warm and dry.  Psychiatric: She has a normal mood and affect. Her behavior is normal. Judgment and thought content normal.    MAU Course  Procedures  MDM Cbc, ultrasound, beta hcg, and ABO; Pt is actually 25 and ) days by u/s. Prenatal panel drawn. Initial high blood pressure noted on admission. Serial b/ps and Pre E labs drawn and wnl. Pt pain is resolved. Pt is planning to go to [facility] for care.  Assessment and Plan  IUP @ 25+0  Initiate prenatal care. Discharge to home  [facility] 08/13/2014, 11:44 PM

## 2014-08-13 NOTE — MAU Note (Signed)
Positive home pregnancy test 2 days ago. LMP mid April. No birth control.  Abdominal cramping x 3 weeks. Denies vaginal bleeding or discharge.

## 2014-08-14 ENCOUNTER — Encounter (HOSPITAL_COMMUNITY): Payer: Self-pay

## 2014-08-14 DIAGNOSIS — O9989 Other specified diseases and conditions complicating pregnancy, childbirth and the puerperium: Secondary | ICD-10-CM | POA: Diagnosis not present

## 2014-08-14 DIAGNOSIS — O26899 Other specified pregnancy related conditions, unspecified trimester: Secondary | ICD-10-CM | POA: Insufficient documentation

## 2014-08-14 DIAGNOSIS — R109 Unspecified abdominal pain: Secondary | ICD-10-CM

## 2014-08-14 DIAGNOSIS — Z3689 Encounter for other specified antenatal screening: Secondary | ICD-10-CM | POA: Insufficient documentation

## 2014-08-14 DIAGNOSIS — Z3A25 25 weeks gestation of pregnancy: Secondary | ICD-10-CM | POA: Insufficient documentation

## 2014-08-14 LAB — RPR: RPR Ser Ql: NONREACTIVE

## 2014-08-14 LAB — DIFFERENTIAL
Basophils Absolute: 0 10*3/uL (ref 0.0–0.1)
Basophils Relative: 0 % (ref 0–1)
Eosinophils Absolute: 0.1 10*3/uL (ref 0.0–0.7)
Eosinophils Relative: 1 % (ref 0–5)
Lymphocytes Relative: 23 % (ref 12–46)
Lymphs Abs: 2.7 10*3/uL (ref 0.7–4.0)
Monocytes Absolute: 1.4 10*3/uL — ABNORMAL HIGH (ref 0.1–1.0)
Monocytes Relative: 12 % (ref 3–12)
Neutro Abs: 7.7 10*3/uL (ref 1.7–7.7)
Neutrophils Relative %: 64 % (ref 43–77)

## 2014-08-14 LAB — COMPREHENSIVE METABOLIC PANEL
ALT: 10 U/L — ABNORMAL LOW (ref 14–54)
AST: 14 U/L — ABNORMAL LOW (ref 15–41)
Albumin: 2.7 g/dL — ABNORMAL LOW (ref 3.5–5.0)
Alkaline Phosphatase: 99 U/L (ref 38–126)
Anion gap: 4 — ABNORMAL LOW (ref 5–15)
BUN: 7 mg/dL (ref 6–20)
CO2: 21 mmol/L — ABNORMAL LOW (ref 22–32)
Calcium: 8.6 mg/dL — ABNORMAL LOW (ref 8.9–10.3)
Chloride: 110 mmol/L (ref 101–111)
Creatinine, Ser: 0.54 mg/dL (ref 0.44–1.00)
GFR calc Af Amer: >60 mL/min (ref >60)
GFR calc non Af Amer: >60 mL/min (ref >60)
Glucose, Bld: 89 mg/dL (ref 65–99)
Potassium: 3.6 mmol/L (ref 3.5–5.1)
Sodium: 135 mmol/L (ref 135–145)
Total Bilirubin: 0.4 mg/dL (ref 0.3–1.2)
Total Protein: 6.4 g/dL — ABNORMAL LOW (ref 6.5–8.1)

## 2014-08-14 LAB — HIV ANTIBODY (ROUTINE TESTING W REFLEX): HIV Screen 4th Generation wRfx: NONREACTIVE

## 2014-08-14 LAB — TYPE AND SCREEN
ABO/RH(D): O POS
Antibody Screen: NEGATIVE

## 2014-08-14 LAB — RUBELLA SCREEN: Rubella: 16.7 index (ref >0.99)

## 2014-08-14 LAB — PROTEIN / CREATININE RATIO, URINE
Creatinine, Urine: 65 mg/dL
Total Protein, Urine: <6 mg/dL

## 2014-08-14 LAB — HEPATITIS B SURFACE ANTIGEN: Hepatitis B Surface Ag: NEGATIVE

## 2014-08-14 LAB — ABO/RH: ABO/RH(D): O POS

## 2014-08-14 NOTE — Discharge Instructions (Signed)
Second Trimester of Pregnancy The second trimester is from week 13 through week 28, months 4 through 6. The second trimester is often a time when you feel your best. Your body has also adjusted to being pregnant, and you begin to feel better physically. Usually, morning sickness has lessened or quit completely, you may have more energy, and you may have an increase in appetite. The second trimester is also a time when the fetus is growing rapidly. At the end of the sixth month, the fetus is about 9 inches long and weighs about 1 pounds. You will likely begin to feel the baby move (quickening) between 18 and 20 weeks of the pregnancy. BODY CHANGES Your body goes through many changes during pregnancy. The changes vary from woman to woman.   Your weight will continue to increase. You will notice your lower abdomen bulging out.  You may begin to get stretch marks on your hips, abdomen, and breasts.  You may develop headaches that can be relieved by medicines approved by your health care provider.  You may urinate more often because the fetus is pressing on your bladder.  You may develop or continue to have heartburn as a result of your pregnancy.  You may develop constipation because certain hormones are causing the muscles that push waste through your intestines to slow down.  You may develop hemorrhoids or swollen, bulging veins (varicose veins).  You may have back pain because of the weight gain and pregnancy hormones relaxing your joints between the bones in your pelvis and as a result of a shift in weight and the muscles that support your balance.  Your breasts will continue to grow and be tender.  Your gums may bleed and may be sensitive to brushing and flossing.  Dark spots or blotches (chloasma, mask of pregnancy) may develop on your face. This will likely fade after the baby is born.  A dark line from your belly button to the pubic area (linea nigra) may appear. This will likely fade  after the baby is born.  You may have changes in your hair. These can include thickening of your hair, rapid growth, and changes in texture. Some women also have hair loss during or after pregnancy, or hair that feels dry or thin. Your hair will most likely return to normal after your baby is born. WHAT TO EXPECT AT YOUR PRENATAL VISITS During a routine prenatal visit:  You will be weighed to make sure you and the fetus are growing normally.  Your blood pressure will be taken.  Your abdomen will be measured to track your baby's growth.  The fetal heartbeat will be listened to.  Any test results from the previous visit will be discussed. Your health care provider may ask you:  How you are feeling.  If you are feeling the baby move.  If you have had any abnormal symptoms, such as leaking fluid, bleeding, severe headaches, or abdominal cramping.  If you have any questions. Other tests that may be performed during your second trimester include:  Blood tests that check for:  Low iron levels (anemia).  Gestational diabetes (between 24 and 28 weeks).  Rh antibodies.  Urine tests to check for infections, diabetes, or protein in the urine.  An ultrasound to confirm the proper growth and development of the baby.  An amniocentesis to check for possible genetic problems.  Fetal screens for spina bifida and Down syndrome. HOME CARE INSTRUCTIONS   Avoid all smoking, herbs, alcohol, and unprescribed   drugs. These chemicals affect the formation and growth of the baby.  Follow your health care provider's instructions regarding medicine use. There are medicines that are either safe or unsafe to take during pregnancy.  Exercise only as directed by your health care provider. Experiencing uterine cramps is a good sign to stop exercising.  Continue to eat regular, healthy meals.  Wear a good support bra for breast tenderness.  Do not use hot tubs, steam rooms, or saunas.  Wear your  seat belt at all times when driving.  Avoid raw meat, uncooked cheese, cat litter boxes, and soil used by cats. These carry germs that can cause birth defects in the baby.  Take your prenatal vitamins.  Try taking a stool softener (if your health care provider approves) if you develop constipation. Eat more high-fiber foods, such as fresh vegetables or fruit and whole grains. Drink plenty of fluids to keep your urine clear or pale yellow.  Take warm sitz baths to soothe any pain or discomfort caused by hemorrhoids. Use hemorrhoid cream if your health care provider approves.  If you develop varicose veins, wear support hose. Elevate your feet for 15 minutes, 3-4 times a day. Limit salt in your diet.  Avoid heavy lifting, wear low heel shoes, and practice good posture.  Rest with your legs elevated if you have leg cramps or low back pain.  Visit your dentist if you have not gone yet during your pregnancy. Use a soft toothbrush to brush your teeth and be gentle when you floss.  A sexual relationship may be continued unless your health care provider directs you otherwise.  Continue to go to all your prenatal visits as directed by your health care provider. SEEK MEDICAL CARE IF:   You have dizziness.  You have mild pelvic cramps, pelvic pressure, or nagging pain in the abdominal area.  You have persistent nausea, vomiting, or diarrhea.  You have a bad smelling vaginal discharge.  You have pain with urination. SEEK IMMEDIATE MEDICAL CARE IF:   You have a fever.  You are leaking fluid from your vagina.  You have spotting or bleeding from your vagina.  You have severe abdominal cramping or pain.  You have rapid weight gain or loss.  You have shortness of breath with chest pain.  You notice sudden or extreme swelling of your face, hands, ankles, feet, or legs.  You have not felt your baby move in over an hour.  You have severe headaches that do not go away with  medicine.  You have vision changes. Document Released: 01/31/2001 Document Revised: 02/11/2013 Document Reviewed: 04/09/2012 [employer] Patient Information 2015 [employer]. This information is not intended to replace advice given to you by your health care provider. Make sure you discuss any questions you have with your health care provider.  

## 2015-06-18 ENCOUNTER — Encounter (HOSPITAL_COMMUNITY): Payer: Self-pay | Admitting: *Deleted

## 2020-05-03 ENCOUNTER — Emergency Department (HOSPITAL_BASED_OUTPATIENT_CLINIC_OR_DEPARTMENT_OTHER)
Admission: EM | Admit: 2020-05-03 | Discharge: 2020-05-03 | Disposition: A | Discharge status: Home / Self Care | Payer: PRIVATE HEALTH INSURANCE | Attending: Emergency Medicine | Admitting: Emergency Medicine

## 2020-05-03 ENCOUNTER — Encounter (HOSPITAL_BASED_OUTPATIENT_CLINIC_OR_DEPARTMENT_OTHER): Payer: Self-pay | Admitting: Emergency Medicine

## 2020-05-03 ENCOUNTER — Emergency Department (HOSPITAL_BASED_OUTPATIENT_CLINIC_OR_DEPARTMENT_OTHER): Payer: PRIVATE HEALTH INSURANCE

## 2020-05-03 ENCOUNTER — Other Ambulatory Visit: Payer: Self-pay

## 2020-05-03 ENCOUNTER — Other Ambulatory Visit (HOSPITAL_COMMUNITY): Payer: Self-pay | Admitting: Emergency Medicine

## 2020-05-03 DIAGNOSIS — R079 Chest pain, unspecified: Secondary | ICD-10-CM | POA: Insufficient documentation

## 2020-05-03 DIAGNOSIS — L0291 Cutaneous abscess, unspecified: Secondary | ICD-10-CM

## 2020-05-03 DIAGNOSIS — N611 Abscess of the breast and nipple: Secondary | ICD-10-CM | POA: Insufficient documentation

## 2020-05-03 DIAGNOSIS — R000 Tachycardia, unspecified: Secondary | ICD-10-CM | POA: Diagnosis not present

## 2020-05-03 HISTORY — DX: Other pulmonary embolism without acute cor pulmonale: I26.99

## 2020-05-03 LAB — COMPREHENSIVE METABOLIC PANEL
ALT: 65 U/L — ABNORMAL HIGH (ref 0–44)
AST: 31 U/L (ref 15–41)
Albumin: 3.3 g/dL — ABNORMAL LOW (ref 3.5–5.0)
Alkaline Phosphatase: 59 U/L (ref 38–126)
Anion gap: 11 (ref 5–15)
BUN: 6 mg/dL (ref 6–20)
CO2: 22 mmol/L (ref 22–32)
Calcium: 8.8 mg/dL — ABNORMAL LOW (ref 8.9–10.3)
Chloride: 104 mmol/L (ref 98–111)
Creatinine, Ser: 0.72 mg/dL (ref 0.44–1.00)
GFR, Estimated: >60 mL/min (ref >60)
Glucose, Bld: 96 mg/dL (ref 70–99)
Potassium: 3.6 mmol/L (ref 3.5–5.1)
Sodium: 137 mmol/L (ref 135–145)
Total Bilirubin: 0.3 mg/dL (ref 0.3–1.2)
Total Protein: 8 g/dL (ref 6.5–8.1)

## 2020-05-03 LAB — CBC WITH DIFFERENTIAL/PLATELET
Abs Immature Granulocytes: 0.06 10*3/uL (ref 0.00–0.07)
Basophils Absolute: 0 10*3/uL (ref 0.0–0.1)
Basophils Relative: 0 %
Eosinophils Absolute: 0.2 10*3/uL (ref 0.0–0.5)
Eosinophils Relative: 1 %
HCT: 39.8 % (ref 36.0–46.0)
Hemoglobin: 11.9 g/dL — ABNORMAL LOW (ref 12.0–15.0)
Immature Granulocytes: 0 %
Lymphocytes Relative: 23 %
Lymphs Abs: 3.2 10*3/uL (ref 0.7–4.0)
MCH: 21.9 pg — ABNORMAL LOW (ref 26.0–34.0)
MCHC: 29.9 g/dL — ABNORMAL LOW (ref 30.0–36.0)
MCV: 73.2 fL — ABNORMAL LOW (ref 80.0–100.0)
Monocytes Absolute: 1.1 10*3/uL — ABNORMAL HIGH (ref 0.1–1.0)
Monocytes Relative: 8 %
Neutro Abs: 9.5 10*3/uL — ABNORMAL HIGH (ref 1.7–7.7)
Neutrophils Relative %: 68 %
Platelets: 329 10*3/uL (ref 150–400)
RBC: 5.44 MIL/uL — ABNORMAL HIGH (ref 3.87–5.11)
RDW: 17.2 % — ABNORMAL HIGH (ref 11.5–15.5)
Smear Review: ADEQUATE
WBC: 14 10*3/uL — ABNORMAL HIGH (ref 4.0–10.5)
nRBC: 0 % (ref 0.0–0.2)

## 2020-05-03 LAB — TROPONIN I (HIGH SENSITIVITY): Troponin I (High Sensitivity): 2 ng/L (ref <18)

## 2020-05-03 MED ORDER — MORPHINE SULFATE (PF) 4 MG/ML IV SOLN
4.0000 mg | Freq: Once | INTRAVENOUS | Status: AC
  Administered 2020-05-03: 4 mg via INTRAVENOUS
  Filled 2020-05-03: qty 1

## 2020-05-03 MED ORDER — LIDOCAINE-EPINEPHRINE (PF) 2 %-1:200000 IJ SOLN: 20.0000 mL | Freq: Once | INTRAMUSCULAR | Status: AC

## 2020-05-03 MED ORDER — IOHEXOL 350 MG/ML SOLN
100.0000 mL | Freq: Once | INTRAVENOUS | Status: AC | PRN
  Administered 2020-05-03: 100 mL via INTRAVENOUS

## 2020-05-03 MED ORDER — ONDANSETRON HCL 4 MG/2ML IJ SOLN
4.0000 mg | Freq: Once | INTRAMUSCULAR | Status: AC
Start: 2020-05-03 — End: 2020-05-03
  Administered 2020-05-03: 4 mg via INTRAVENOUS
  Filled 2020-05-03: qty 2

## 2020-05-03 MED ORDER — LIDOCAINE-EPINEPHRINE 2 %-1:100000 IJ SOLN: 10.0000 mL | Freq: Once | INTRAMUSCULAR | Status: DC

## 2020-05-03 MED ORDER — DOXYCYCLINE HYCLATE 100 MG PO CAPS: 100.0000 mg | ORAL_CAPSULE | Freq: Two times a day (BID) | ORAL | 0 refills | Status: DC

## 2020-05-03 MED ORDER — HYDROCODONE-ACETAMINOPHEN 5-325 MG PO TABS: 1.0000 | ORAL_TABLET | ORAL | 0 refills | Status: DC | PRN

## 2020-05-03 MED ORDER — LIDOCAINE-EPINEPHRINE (PF) 2 %-1:200000 IJ SOLN
INTRAMUSCULAR | Status: AC
  Administered 2020-05-03: 20 mL via INTRADERMAL
  Filled 2020-05-03: qty 20

## 2020-05-03 NOTE — ED Notes (Signed)
Patient transported to CT 

## 2020-05-03 NOTE — ED Triage Notes (Addendum)
Left sided chest pain, severe with movement and touch.  Denies any injuries, no cough or cold symptoms.  Non radiating.  No sob, no diaphoresis, no dizziness, no fever.  No recent travel or leg pain.  Pt does admit to taking birth control.  Feels similar to previous PE.

## 2020-05-03 NOTE — ED Provider Notes (Signed)
[facility] EMERGENCY DEPARTMENT Provider Note   CSN: [id number] Arrival date & time: 05/03/20  0830     History Chief Complaint  Patient presents with  . Chest Pain    Vicki Mays is a 30 y.o. female.  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 Medical history non-contributory   . PE (pulmonary thromboembolism) [facility])     Patient Active Problem List   Diagnosis Date Noted  . Abdominal pain affecting pregnancy   . Encounter for fetal anatomic survey   . Amblyopia, both eyes 02/27/2013  . Refractive error 11/19/2012  . 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 EYE SURGERY       OB History    Gravida  1   Para  0   Term  0   Preterm  0   AB  0   Living  0     SAB  0   IAB  0   Ectopic  0   Multiple  0   Live Births              No family history on file.  Social History   Tobacco Use  . Smoking status: Never Smoker  . Smokeless tobacco: Never Used  Substance Use Topics  . Alcohol use: No  . Drug use: No    Home Medications Prior to Admission medications   Medication Sig Start Date End Date Taking? Authorizing Provider  doxycycline (VIBRAMYCIN) 100 MG capsule Take 1 capsule (100 mg total) by mouth 2 (two) times daily. 05/03/20  Yes Alvira Monday, MD  HYDROcodone-acetaminophen (NORCO/VICODIN) 5-325 MG tablet Take 1 tablet by mouth every 4 (four) hours as needed. 05/03/20  Yes  Alvira Monday, MD  cyclobenzaprine (FLEXERIL) 5 MG tablet PLEASE SEE ATTACHED FOR DETAILED DIRECTIONS 02/24/20   [provider]  LOW-OGESTREL 0.3-30 MG-MCG tablet Take by mouth. 04/14/20   [provider]    Allergies    Amoxicillin  Review of Systems   Review of Systems  Constitutional: Negative for fever.  HENT: Negative for sore throat.   Eyes: Negative for visual disturbance.  Respiratory: Negative for cough and shortness of breath.   Cardiovascular: Positive for chest pain.  Gastrointestinal: Negative for abdominal pain, nausea and vomiting.  Genitourinary: Negative for difficulty urinating.  Musculoskeletal: Negative for neck pain.  Skin: Negative for rash.  Neurological: Negative for syncope, light-headedness and headaches.    Physical Exam Updated Vital Signs BP 119/88   Pulse 94   Temp 98.6 F (37 C) (Oral)   Resp 18   Ht 5\' 2"  (1.575 m)   Wt 133.8 kg   LMP 04/19/2020   SpO2 98%   BMI 53.96 kg/m   Physical Exam Vitals and nursing note reviewed.  Constitutional:      General: She is not in acute distress.    Appearance: She is well-developed. She is not diaphoretic.  HENT:  Head: Normocephalic and atraumatic.  Eyes:     Conjunctiva/sclera: Conjunctivae normal.  Cardiovascular:     Rate and Rhythm: Regular rhythm. Tachycardia present.     Heart sounds: Normal heart sounds. No murmur heard. No friction rub. No gallop.   Pulmonary:     Effort: Pulmonary effort is normal. No respiratory distress.     Breath sounds: Normal breath sounds. No wheezing or rales.  Chest:     Chest wall: Tenderness present.  Abdominal:     General: There is no distension.     Palpations: Abdomen is soft.     Tenderness: There is no abdominal tenderness. There is no guarding.  Musculoskeletal:        General: No tenderness.     Cervical back: Normal range of motion.  Skin:    General: Skin is warm and dry.     Findings: No erythema or rash.   Neurological:     Mental Status: She is alert and oriented to person, place, and time.     ED Results / Procedures / Treatments   Labs (all labs ordered are listed, but only abnormal results are displayed) Labs Reviewed  CBC WITH DIFFERENTIAL/PLATELET - Abnormal; Notable for the following components:      Result Value   WBC 14.0 (*)    RBC 5.44 (*)    Hemoglobin 11.9 (*)    MCV 73.2 (*)    MCH 21.9 (*)    MCHC 29.9 (*)    RDW 17.2 (*)    Neutro Abs 9.5 (*)    Monocytes Absolute 1.1 (*)    All other components within normal limits  COMPREHENSIVE METABOLIC PANEL - Abnormal; Notable for the following components:   Calcium 8.8 (*)    Albumin 3.3 (*)    ALT 65 (*)    All other components within normal limits  TROPONIN I (HIGH SENSITIVITY)    EKG EKG Interpretation  Date/Time:  Monday May 03 2020 08:45:35 EDT Ventricular Rate:  114 PR Interval:    QRS Duration: 91 QT Interval:  340 QTC Calculation: 469 R Axis:   3 Text Interpretation: Sinus tachycardia LVH by voltage No previous ECGs available Confirmed by Alvira MondaySchlossman, Oliva Montecalvo ([id number]) on 05/03/2020 11:13:27 AM   Radiology CT Angio Chest PE W and/or Wo Contrast  Result Date: 05/03/2020 CLINICAL DATA:  30 year old female with left side chest pain, no known injury. EXAM: CT ANGIOGRAPHY CHEST WITH CONTRAST TECHNIQUE: Multidetector CT imaging of the chest was performed using the standard protocol during bolus administration of intravenous contrast. Multiplanar CT image reconstructions and MIPs were obtained to evaluate the vascular anatomy. CONTRAST:  100mL OMNIPAQUE IOHEXOL 350 MG/ML SOLN COMPARISON:  CT Abdomen and Pelvis 03/11/2009. FINDINGS: Cardiovascular: Adequate contrast bolus timing in the pulmonary arterial tree. Mild respiratory motion. No pulmonary artery filling defect identified to the distal segmental branches. Upper lobe subsegmental branches also appear patent. Middle and lower lobe subsegmental and distal branches  are not well evaluated due to combined motion and contrast timing. No cardiomegaly or pericardial effusion. Normal visible aorta. No calcified coronary artery atherosclerosis is evident. Mediastinum/Nodes: Negative. No mediastinal lymphadenopathy. Small volume residual thymus. Lungs/Pleura: Major airways are patent and both lungs appear clear. No pleural effusion or abnormal pulmonary opacity. Upper Abdomen: Stable and negative visible liver, spleen, pancreas, adrenal glands, kidneys and bowel in the upper abdomen. Musculoskeletal: No osseous abnormality identified. Superficial soft tissue swelling and stranding along the crease of the left lower breast in an area of 5-6  cm (series 4, image 65 and series 6, image 121). No soft tissue gas. No drainable fluid. Review of the MIP images confirms the above findings. IMPRESSION: 1. No pulmonary embolus identified, middle and lower lobe subsegmental and distal branches are obscured. 2. Superficial soft tissue swelling and stranding in a 5-6 cm area along the crease of the left lower breast compatible with cellulitis, phlegmon. No drainable fluid collection or soft tissue gas. 3. Otherwise negative CT appearance of the chest. Electronically Signed   By: Odessa Fleming M.D.   On: 05/03/2020 10:41    Procedures .Marland KitchenIncision and Drainage  Date/Time: 05/04/2020 6:48 AM Performed by: Alvira Monday, MD Authorized by: Alvira Monday, MD   Consent:    Consent obtained:  Verbal   Consent given by:  Patient   Risks, benefits, and alternatives were discussed: yes     Risks discussed:  Bleeding, damage to other organs, incomplete drainage, infection and pain   Alternatives discussed:  Delayed treatment and no treatment Universal protocol:    Patient identity confirmed:  Verbally with patient Location:    Type:  Abscess   Size:  2   Location:  Trunk   Trunk location:  L breast (border chest wall and L breast) Pre-procedure details:    Skin preparation:  Antiseptic  wash Anesthesia:    Anesthesia method:  Local infiltration Procedure type:    Complexity:  Simple Procedure details:    Ultrasound guidance: yes     Incision types:  Stab incision   Incision depth:  Subcutaneous   Wound management:  Probed and deloculated   Drainage:  Purulent   Drainage amount:  Copious   Packing materials:  1/4 in iodoform gauze   Amount 1/4" iodoform:  8 Post-procedure details:    Procedure completion:  Tolerated well, no immediate complications     Medications Ordered in ED Medications  iohexol (OMNIPAQUE) 350 MG/ML injection 100 mL (100 mLs Intravenous Contrast Given 05/03/20 1021)  ondansetron (ZOFRAN) injection 4 mg (4 mg Intravenous Given 05/03/20 1014)  morphine 4 MG/ML injection 4 mg (4 mg Intravenous Given 05/03/20 1158)  lidocaine-EPINEPHrine (XYLOCAINE W/EPI) 2 %-1:200000 (PF) injection 20 mL (20 mLs Intradermal Given by Other 05/03/20 1158)    ED Course  I have reviewed the triage vital signs and the nursing notes.  Pertinent labs & imaging results that were available during my care of the patient were reviewed by me and considered in my medical decision making (see chart for details).    MDM Rules/Calculators/A&P                          29yo female with history of PE presents with concern for chest pain. Differential diagnosis for chest pain includes pulmonary embolus, dissection, pneumothorax, pneumonia, ACS, myocarditis, pericarditis.  EKG was done and evaluate by me and showed no acute ST changes and no signs of pericarditis.  Troponin negative.  She is high risk Wells with score of 6, with tachycardia, hx of PE and on OCPs and symptoms that feel similar to prior PE and CT ordered.  CT shows no evidence of PE, does show phlegmon along cerase of left lower breast.     CT does not show drainable collection however bedside US does show abscess.  Given superficial abscess and location at peripheral of breast feel most appropriate management is ED  drainage.  Copious purulenc expressed from small incision. Rec tylenol, ibuprofen, given rx for norco for breakthrough pain  after review in [place] drug database.  Given rx for doxycycline and recommend follow up in 48hr or PCP for reevaluation  Patient discharged in stable condition with understanding of reasons to return and recommend PCP follow up.    Final Clinical Impression(s) / ED Diagnoses Final diagnoses:  Abscess  Left-sided chest pain    Rx / DC Orders ED Discharge Orders         Ordered    doxycycline (VIBRAMYCIN) 100 MG capsule  2 times daily        05/03/20 1248    HYDROcodone-acetaminophen (NORCO/VICODIN) 5-325 MG tablet  Every 4 hours PRN        05/03/20 1248           Alvira Monday, MD 05/04/20 [phone]

## 2020-05-03 NOTE — ED Notes (Signed)
ED Provider at bedside. 

## 2020-05-05 ENCOUNTER — Other Ambulatory Visit: Payer: Self-pay

## 2020-05-05 ENCOUNTER — Encounter (HOSPITAL_COMMUNITY): Payer: Self-pay | Admitting: Internal Medicine

## 2020-05-05 ENCOUNTER — Observation Stay (HOSPITAL_BASED_OUTPATIENT_CLINIC_OR_DEPARTMENT_OTHER)
Admission: EM | Admit: 2020-05-05 | Discharge: 2020-05-06 | Disposition: A | Discharge status: Home / Self Care | Payer: PRIVATE HEALTH INSURANCE | Attending: Internal Medicine | Admitting: Internal Medicine

## 2020-05-05 DIAGNOSIS — N611 Abscess of the breast and nipple: Secondary | ICD-10-CM

## 2020-05-05 DIAGNOSIS — E876 Hypokalemia: Secondary | ICD-10-CM | POA: Diagnosis not present

## 2020-05-05 DIAGNOSIS — Z79899 Other long term (current) drug therapy: Secondary | ICD-10-CM | POA: Diagnosis not present

## 2020-05-05 DIAGNOSIS — E66813 Obesity, class 3: Secondary | ICD-10-CM

## 2020-05-05 DIAGNOSIS — Z20822 Contact with and (suspected) exposure to covid-19: Secondary | ICD-10-CM | POA: Diagnosis not present

## 2020-05-05 DIAGNOSIS — R112 Nausea with vomiting, unspecified: Principal | ICD-10-CM | POA: Insufficient documentation

## 2020-05-05 DIAGNOSIS — Z6841 Body Mass Index (BMI) 40.0 and over, adult: Secondary | ICD-10-CM | POA: Diagnosis not present

## 2020-05-05 DIAGNOSIS — L0390 Cellulitis, unspecified: Secondary | ICD-10-CM | POA: Diagnosis present

## 2020-05-05 DIAGNOSIS — E669 Obesity, unspecified: Secondary | ICD-10-CM | POA: Diagnosis not present

## 2020-05-05 DIAGNOSIS — D509 Iron deficiency anemia, unspecified: Secondary | ICD-10-CM

## 2020-05-05 DIAGNOSIS — R197 Diarrhea, unspecified: Secondary | ICD-10-CM

## 2020-05-05 DIAGNOSIS — Z789 Other specified health status: Secondary | ICD-10-CM

## 2020-05-05 DIAGNOSIS — L03311 Cellulitis of abdominal wall: Secondary | ICD-10-CM

## 2020-05-05 DIAGNOSIS — L03313 Cellulitis of chest wall: Secondary | ICD-10-CM | POA: Diagnosis not present

## 2020-05-05 LAB — CBC WITH DIFFERENTIAL/PLATELET
Abs Immature Granulocytes: 0.03 10*3/uL (ref 0.00–0.07)
Basophils Absolute: 0.1 10*3/uL (ref 0.0–0.1)
Basophils Relative: 1 %
Eosinophils Absolute: 0.2 10*3/uL (ref 0.0–0.5)
Eosinophils Relative: 2 %
HCT: 41.4 % (ref 36.0–46.0)
Hemoglobin: 12.1 g/dL (ref 12.0–15.0)
Immature Granulocytes: 0 %
Lymphocytes Relative: 31 %
Lymphs Abs: 3.3 10*3/uL (ref 0.7–4.0)
MCH: 21.9 pg — ABNORMAL LOW (ref 26.0–34.0)
MCHC: 29.2 g/dL — ABNORMAL LOW (ref 30.0–36.0)
MCV: 75 fL — ABNORMAL LOW (ref 80.0–100.0)
Monocytes Absolute: 0.7 10*3/uL (ref 0.1–1.0)
Monocytes Relative: 7 %
Neutro Abs: 6.4 10*3/uL (ref 1.7–7.7)
Neutrophils Relative %: 59 %
Platelets: 338 10*3/uL (ref 150–400)
RBC: 5.52 MIL/uL — ABNORMAL HIGH (ref 3.87–5.11)
RDW: 17.2 % — ABNORMAL HIGH (ref 11.5–15.5)
WBC: 10.8 10*3/uL — ABNORMAL HIGH (ref 4.0–10.5)
nRBC: 0 % (ref 0.0–0.2)

## 2020-05-05 LAB — COMPREHENSIVE METABOLIC PANEL
ALT: 69 U/L — ABNORMAL HIGH (ref 0–44)
AST: 35 U/L (ref 15–41)
Albumin: 3.3 g/dL — ABNORMAL LOW (ref 3.5–5.0)
Alkaline Phosphatase: 57 U/L (ref 38–126)
Anion gap: 9 (ref 5–15)
BUN: <5 mg/dL — ABNORMAL LOW (ref 6–20)
CO2: 25 mmol/L (ref 22–32)
Calcium: 8.9 mg/dL (ref 8.9–10.3)
Chloride: 104 mmol/L (ref 98–111)
Creatinine, Ser: 0.73 mg/dL (ref 0.44–1.00)
GFR, Estimated: >60 mL/min (ref >60)
Glucose, Bld: 108 mg/dL — ABNORMAL HIGH (ref 70–99)
Potassium: 3.3 mmol/L — ABNORMAL LOW (ref 3.5–5.1)
Sodium: 138 mmol/L (ref 135–145)
Total Bilirubin: 0.1 mg/dL — ABNORMAL LOW (ref 0.3–1.2)
Total Protein: 8.2 g/dL — ABNORMAL HIGH (ref 6.5–8.1)

## 2020-05-05 LAB — CREATININE, SERUM
Creatinine, Ser: 0.8 mg/dL (ref 0.44–1.00)
GFR, Estimated: >60 mL/min (ref >60)

## 2020-05-05 LAB — LACTIC ACID, PLASMA
Lactic Acid, Venous: 1.1 mmol/L (ref 0.5–1.9)
Lactic Acid, Venous: 1.1 mmol/L (ref 0.5–1.9)

## 2020-05-05 LAB — TSH: TSH: 2.172 u[IU]/mL (ref 0.350–4.500)

## 2020-05-05 LAB — CBC
HCT: 38.1 % (ref 36.0–46.0)
Hemoglobin: 11.3 g/dL — ABNORMAL LOW (ref 12.0–15.0)
MCH: 22.2 pg — ABNORMAL LOW (ref 26.0–34.0)
MCHC: 29.7 g/dL — ABNORMAL LOW (ref 30.0–36.0)
MCV: 75 fL — ABNORMAL LOW (ref 80.0–100.0)
Platelets: 283 10*3/uL (ref 150–400)
RBC: 5.08 MIL/uL (ref 3.87–5.11)
RDW: 16.9 % — ABNORMAL HIGH (ref 11.5–15.5)
WBC: 9.6 10*3/uL (ref 4.0–10.5)
nRBC: 0 % (ref 0.0–0.2)

## 2020-05-05 LAB — URINALYSIS, ROUTINE W REFLEX MICROSCOPIC
Glucose, UA: NEGATIVE mg/dL
Hgb urine dipstick: NEGATIVE
Ketones, ur: NEGATIVE mg/dL
Leukocytes,Ua: NEGATIVE
Nitrite: NEGATIVE
Protein, ur: NEGATIVE mg/dL
Specific Gravity, Urine: >1.03 — ABNORMAL HIGH (ref 1.005–1.030)
pH: 6 (ref 5.0–8.0)

## 2020-05-05 LAB — RESP PANEL BY RT-PCR (FLU A&B, COVID) ARPGX2
Influenza A by PCR: NEGATIVE
Influenza B by PCR: NEGATIVE
SARS Coronavirus 2 by RT PCR: NEGATIVE

## 2020-05-05 LAB — PREGNANCY, URINE: Preg Test, Ur: NEGATIVE

## 2020-05-05 LAB — IRON AND TIBC
Iron: 20 ug/dL — ABNORMAL LOW (ref 28–170)
Saturation Ratios: 6 % — ABNORMAL LOW (ref 10.4–31.8)
TIBC: 357 ug/dL (ref 250–450)
UIBC: 337 ug/dL

## 2020-05-05 LAB — HIV ANTIBODY (ROUTINE TESTING W REFLEX): HIV Screen 4th Generation wRfx: NONREACTIVE

## 2020-05-05 LAB — VITAMIN B12: Vitamin B-12: 140 pg/mL — ABNORMAL LOW (ref 180–914)

## 2020-05-05 LAB — FERRITIN: Ferritin: 46 ng/mL (ref 11–307)

## 2020-05-05 MED ORDER — ALUM & MAG HYDROXIDE-SIMETH 200-200-20 MG/5ML PO SUSP: 30.0000 mL | ORAL | Status: DC | PRN

## 2020-05-05 MED ORDER — DM-GUAIFENESIN ER 30-600 MG PO TB12: 1.0000 | ORAL_TABLET | Freq: Two times a day (BID) | ORAL | Status: DC | PRN

## 2020-05-05 MED ORDER — POTASSIUM CHLORIDE CRYS ER 20 MEQ PO TBCR
40.0000 meq | EXTENDED_RELEASE_TABLET | Freq: Once | ORAL | Status: AC
  Administered 2020-05-05: 40 meq via ORAL
  Filled 2020-05-05: qty 2

## 2020-05-05 MED ORDER — MORPHINE SULFATE (PF) 4 MG/ML IV SOLN
4.0000 mg | Freq: Once | INTRAVENOUS | Status: AC
Start: 2020-05-05 — End: 2020-05-05
  Administered 2020-05-05: 4 mg via INTRAVENOUS
  Filled 2020-05-05: qty 1

## 2020-05-05 MED ORDER — HYDROCORTISONE 1 % EX CREA
1.0000 "application " | TOPICAL_CREAM | Freq: Three times a day (TID) | CUTANEOUS | Status: DC | PRN
  Filled 2020-05-05: qty 28

## 2020-05-05 MED ORDER — LIP MEDEX EX OINT
1.0000 "application " | TOPICAL_OINTMENT | CUTANEOUS | Status: DC | PRN
  Filled 2020-05-05: qty 7

## 2020-05-05 MED ORDER — VANCOMYCIN HCL 1250 MG/250ML IV SOLN
1250.0000 mg | Freq: Two times a day (BID) | INTRAVENOUS | Status: DC
  Administered 2020-05-05 – 2020-05-06 (×2): 1250 mg via INTRAVENOUS
  Filled 2020-05-05 (×4): qty 250

## 2020-05-05 MED ORDER — SENNOSIDES-DOCUSATE SODIUM 8.6-50 MG PO TABS: 1.0000 | ORAL_TABLET | Freq: Every evening | ORAL | Status: DC | PRN

## 2020-05-05 MED ORDER — ONDANSETRON HCL 4 MG/2ML IJ SOLN
INTRAMUSCULAR | Status: AC
  Filled 2020-05-05: qty 2

## 2020-05-05 MED ORDER — PHENOL 1.4 % MT LIQD: 1.0000 | OROMUCOSAL | Status: DC | PRN

## 2020-05-05 MED ORDER — SALINE SPRAY 0.65 % NA SOLN
2.0000 | NASAL | Status: DC | PRN
  Filled 2020-05-05: qty 44

## 2020-05-05 MED ORDER — ONDANSETRON HCL 4 MG/2ML IJ SOLN
4.0000 mg | Freq: Once | INTRAMUSCULAR | Status: AC
  Administered 2020-05-05: 4 mg via INTRAVENOUS

## 2020-05-05 MED ORDER — OXYCODONE HCL 5 MG PO TABS
5.0000 mg | ORAL_TABLET | ORAL | Status: DC | PRN
  Administered 2020-05-05 – 2020-05-06 (×2): 5 mg via ORAL
  Filled 2020-05-05 (×2): qty 1

## 2020-05-05 MED ORDER — HYDROCORTISONE (PERIANAL) 2.5 % EX CREA
1.0000 "application " | TOPICAL_CREAM | Freq: Four times a day (QID) | CUTANEOUS | Status: DC | PRN
  Filled 2020-05-05: qty 28.35

## 2020-05-05 MED ORDER — MORPHINE SULFATE (PF) 2 MG/ML IV SOLN: 2.0000 mg | INTRAVENOUS | Status: DC | PRN

## 2020-05-05 MED ORDER — VANCOMYCIN HCL IN DEXTROSE 1-5 GM/200ML-% IV SOLN
1000.0000 mg | INTRAVENOUS | Status: AC
  Administered 2020-05-05 (×2): 1000 mg via INTRAVENOUS
  Filled 2020-05-05 (×2): qty 200

## 2020-05-05 MED ORDER — ACETAMINOPHEN 325 MG PO TABS: 650.0000 mg | ORAL_TABLET | Freq: Four times a day (QID) | ORAL | Status: DC | PRN

## 2020-05-05 MED ORDER — MUSCLE RUB 10-15 % EX CREA
1.0000 "application " | TOPICAL_CREAM | CUTANEOUS | Status: DC | PRN
  Filled 2020-05-05: qty 85

## 2020-05-05 MED ORDER — POLYVINYL ALCOHOL 1.4 % OP SOLN
1.0000 [drp] | OPHTHALMIC | Status: DC | PRN
  Filled 2020-05-05: qty 15

## 2020-05-05 MED ORDER — MAGNESIUM CITRATE PO SOLN: 1.0000 | Freq: Once | ORAL | Status: DC | PRN

## 2020-05-05 MED ORDER — HYDRALAZINE HCL 20 MG/ML IJ SOLN: 10.0000 mg | INTRAMUSCULAR | Status: DC | PRN

## 2020-05-05 MED ORDER — ONDANSETRON HCL 4 MG/2ML IJ SOLN
4.0000 mg | Freq: Once | INTRAMUSCULAR | Status: AC
  Administered 2020-05-05: 4 mg via INTRAVENOUS
  Filled 2020-05-05: qty 2

## 2020-05-05 MED ORDER — LORATADINE 10 MG PO TABS: 10.0000 mg | ORAL_TABLET | Freq: Every day | ORAL | Status: DC | PRN

## 2020-05-05 MED ORDER — HEPARIN SODIUM (PORCINE) 5000 UNIT/ML IJ SOLN
5000.0000 [IU] | Freq: Three times a day (TID) | INTRAMUSCULAR | Status: DC
  Administered 2020-05-05 – 2020-05-06 (×3): 5000 [IU] via SUBCUTANEOUS
  Filled 2020-05-05 (×3): qty 1

## 2020-05-05 MED ORDER — SODIUM CHLORIDE 0.9 % IV BOLUS
1000.0000 mL | Freq: Once | INTRAVENOUS | Status: AC
  Administered 2020-05-05: 1000 mL via INTRAVENOUS

## 2020-05-05 MED ORDER — ONDANSETRON HCL 4 MG/2ML IJ SOLN
4.0000 mg | Freq: Four times a day (QID) | INTRAMUSCULAR | Status: DC | PRN
  Administered 2020-05-05 – 2020-05-06 (×2): 4 mg via INTRAVENOUS
  Filled 2020-05-05 (×2): qty 2

## 2020-05-05 NOTE — ED Notes (Signed)
Report called to [facility] ETA [phone]

## 2020-05-05 NOTE — ED Notes (Signed)
ED Provider at bedside. 

## 2020-05-05 NOTE — Consult Note (Signed)
Vicki Mays November 20, 1990  [id number].    Requesting MD: Dr. Nelson Chimes Chief Complaint/Reason for Consult: Left breast cellulitis  HPI: Vicki Mays is a 30 y.o. female with a hx of PE per chart review not on anticoagulation who presented with left breast pain and n/v. Patient initially presented on 3/14 with left breast pain. WBC at that time 14. CTA with "superficial soft tissue swelling and stranding in a 5-6 cm area along the crease of the left lower breast compatible with cellulitis, phlegmon. No drainable fluid collection or soft tissue gas". Patient underwent I&D of the left breast in the ED with copious purulent fluid expressed per note. She was started on doxycycline but states that every time she tried to take the antibiotic she would vomit. She returned to the ED today with inability to take abx as prescribed and persistent left breast pain. States that her breast pain has improved some since drainage, and the wound continues to drain. WBC 10.8. She was admitted for IV abx and surgical evaluation. No prior breast surgeries.   ROS: Review of Systems  Constitutional: Positive for fever. Negative for chills.  Respiratory: Negative for cough and shortness of breath.   Cardiovascular: Negative.  Chest pain: left breast.  Gastrointestinal: Positive for diarrhea, nausea and vomiting. Negative for abdominal pain.  Genitourinary: Negative for dysuria.  Skin:       Left breast pain  All other systems reviewed and are negative.   No family history on file.  Past Medical History:  Diagnosis Date  . Medical history non-contributory   . PE (pulmonary thromboembolism) (HCC)     Past Surgical History:  Procedure Laterality Date  . EYE SURGERY      Social History:  reports that she has never smoked. She has never used smokeless tobacco. She reports that she does not drink alcohol and does not use drugs. Patient works at [employer]  No alcohol use No illicit drug use No tobacco  use Employment: CNA at [employer] Lives at home alone  Allergies:  Allergies  Allergen Reactions  . Amoxicillin Swelling    Medications Prior to Admission  Medication Sig Dispense Refill  . cyclobenzaprine (FLEXERIL) 5 MG tablet PLEASE SEE ATTACHED FOR DETAILED DIRECTIONS    . doxycycline (VIBRAMYCIN) 100 MG capsule Take 1 capsule (100 mg total) by mouth 2 (two) times daily. 20 capsule 0  . HYDROcodone-acetaminophen (NORCO/VICODIN) 5-325 MG tablet Take 1 tablet by mouth every 4 (four) hours as needed. 9 tablet 0  . LOW-OGESTREL 0.3-30 MG-MCG tablet Take by mouth.       Physical Exam: Blood pressure 122/80, pulse 71, temperature 98.9 F (37.2 C), temperature source Oral, resp. rate 16, height 5\' 2"  (1.575 m), weight 133 kg, last menstrual period 04/19/2020, SpO2 98 %, unknown if currently breastfeeding. General: pleasant, WD/WN female who is laying in bed in NAD HEENT: head is normocephalic, atraumatic.  Sclera are noninjected.  PERRL.  Ears and nose without any masses or lesions.  Mouth is pink and moist. Dentition fair Heart: regular, rate, and rhythm.  Normal s1,s2. No obvious murmurs, gallops, or rubs noted.  Palpable pedal pulses bilaterally  Lungs: CTAB, no wheezes, rhonchi, or rales noted.  Respiratory effort nonlabored Abd: Soft, NT/ND, +BS, no masses, hernias, or organomegaly MS: no BUE/BLE edema, calves soft and nontender Psych: A&Ox4 with an appropriate affect Neuro: cranial nerves grossly intact, equal strength in BUE/BLE bilaterally, normal speech, thought process intact, gait not assessed Skin: Left breast with 1cm opening  at inframammary region, wound probes about 1-2cm, there is no active drainage, induration, or cellulitis. Warm and dry with no masses, lesions, or rashes   Results for orders placed or performed during the hospital encounter of 05/05/20 (from the past 48 hour(s))  Pregnancy, urine     Status: None   Collection Time: 05/05/20  9:35 AM  Result Value Ref  Range   Preg Test, Ur NEGATIVE NEGATIVE    Comment:        THE SENSITIVITY OF THIS METHODOLOGY IS >20 mIU/mL. Performed at [facility], [address] [place], [place] [address]   Urinalysis, Routine w reflex microscopic Urine, Clean Catch     Status: Abnormal   Collection Time: 05/05/20  9:35 AM  Result Value Ref Range   Color, Urine AMBER (A) YELLOW    Comment: BIOCHEMICALS MAY BE AFFECTED BY COLOR   APPearance HAZY (A) CLEAR   Specific Gravity, Urine >1.030 (H) 1.005 - 1.030   pH 6.0 5.0 - 8.0   Glucose, UA NEGATIVE NEGATIVE mg/dL   Hgb urine dipstick NEGATIVE NEGATIVE   Bilirubin Urine SMALL (A) NEGATIVE   Ketones, ur NEGATIVE NEGATIVE mg/dL   Protein, ur NEGATIVE NEGATIVE mg/dL   Nitrite NEGATIVE NEGATIVE   Leukocytes,Ua NEGATIVE NEGATIVE    Comment: Microscopic not done on urines with negative protein, blood, leukocytes, nitrite, or glucose < 500 mg/dL. Performed at [facility], [address] [place], [place] [address]   Lactic acid, plasma     Status: None   Collection Time: 05/05/20  9:35 AM  Result Value Ref Range   Lactic Acid, Venous 1.1 0.5 - 1.9 mmol/L    Comment: Performed at [facility], [address] [place], [place] [address]  CBC with Differential     Status: Abnormal   Collection Time: 05/05/20  9:35 AM  Result Value Ref Range   WBC 10.8 (H) 4.0 - 10.5 K/uL   RBC 5.52 (H) 3.87 - 5.11 MIL/uL   Hemoglobin 12.1 12.0 - 15.0 g/dL   HCT 38.3 33.8 - 32.9 %   MCV 75.0 (L) 80.0 - 100.0 fL   MCH 21.9 (L) 26.0 - 34.0 pg   MCHC 29.2 (L) 30.0 - 36.0 g/dL   RDW 19.1 (H) 66.0 - 60.0 %   Platelets 338 150 - 400 K/uL   nRBC 0.0 0.0 - 0.2 %   Neutrophils Relative % 59 %   Neutro Abs 6.4 1.7 - 7.7 K/uL   Lymphocytes Relative 31 %   Lymphs Abs 3.3 0.7 - 4.0 K/uL   Monocytes Relative 7 %   Monocytes Absolute 0.7 0.1 - 1.0 K/uL   Eosinophils Relative 2 %   Eosinophils Absolute 0.2 0.0 - 0.5 K/uL   Basophils Relative 1 %    Basophils Absolute 0.1 0.0 - 0.1 K/uL   Immature Granulocytes 0 %   Abs Immature Granulocytes 0.03 0.00 - 0.07 K/uL    Comment: Performed at [facility], [address] [place], [place] [address]  Comprehensive metabolic panel     Status: Abnormal   Collection Time: 05/05/20  9:35 AM  Result Value Ref Range   Sodium 138 135 - 145 mmol/L   Potassium 3.3 (L) 3.5 - 5.1 mmol/L   Chloride 104 98 - 111 mmol/L   CO2 25 22 - 32 mmol/L   Glucose, Bld 108 (H) 70 - 99 mg/dL    Comment: Glucose reference range applies  only to samples taken after fasting for at least 8 hours.   BUN <5 (L) 6 - 20 mg/dL   Creatinine, Ser 4.780.73 0.44 - 1.00 mg/dL   Calcium 8.9 8.9 - 29.510.3 mg/dL   Total Protein 8.2 (H) 6.5 - 8.1 g/dL   Albumin 3.3 (L) 3.5 - 5.0 g/dL   AST 35 15 - 41 U/L   ALT 69 (H) 0 - 44 U/L   Alkaline Phosphatase 57 38 - 126 U/L   Total Bilirubin 0.1 (L) 0.3 - 1.2 mg/dL   GFR, Estimated >62>60 >13>60 mL/min    Comment: (NOTE) Calculated using the CKD-EPI Creatinine Equation (2021)    Anion gap 9 5 - 15    Comment: Performed at [facility], [address] [place], [place] [address]  Resp Panel by RT-PCR (Flu A&B, Covid) Nasopharyngeal Swab     Status: None   Collection Time: 05/05/20  9:35 AM   Specimen: Nasopharyngeal Swab; Nasopharyngeal(NP) swabs in vial transport medium  Result Value Ref Range   SARS Coronavirus 2 by RT PCR NEGATIVE NEGATIVE    Comment: (NOTE) SARS-CoV-2 target nucleic acids are NOT DETECTED.  The SARS-CoV-2 RNA is generally detectable in upper respiratory specimens during the acute phase of infection. The lowest concentration of SARS-CoV-2 viral copies this assay can detect is 138 copies/mL. A negative result does not preclude SARS-Cov-2 infection and should not be used as the sole basis for treatment or other patient management decisions. A negative result may occur with  improper specimen collection/handling, submission of specimen other than  nasopharyngeal swab, presence of viral mutation(s) within the areas targeted by this assay, and inadequate number of viral copies(<138 copies/mL). A negative result must be combined with clinical observations, patient history, and epidemiological information. The expected result is Negative.  Fact Sheet for Patients:  BloggerCourse.comhttps://www.fda.gov/media/152166/download  Fact Sheet for Healthcare Providers:  SeriousBroker.ithttps://www.fda.gov/media/152162/download  This test is no t yet approved or cleared by the [place] FDA and  has been authorized for detection and/or diagnosis of SARS-CoV-2 by FDA under an Emergency Use Authorization (EUA). This EUA will remain  in effect (meaning this test can be used) for the duration of the COVID-19 declaration under Section 564(b)(1) of the Act, 21 U.S.C.section 360bbb-3(b)(1), unless the authorization is terminated  or revoked sooner.       Influenza A by PCR NEGATIVE NEGATIVE   Influenza B by PCR NEGATIVE NEGATIVE    Comment: (NOTE) The Xpert Xpress SARS-CoV-2/FLU/RSV plus assay is intended as an aid in the diagnosis of influenza from Nasopharyngeal swab specimens and should not be used as a sole basis for treatment. Nasal washings and aspirates are unacceptable for Xpert Xpress SARS-CoV-2/FLU/RSV testing.  Fact Sheet for Patients: BloggerCourse.comhttps://www.fda.gov/media/152166/download  Fact Sheet for Healthcare Providers: SeriousBroker.ithttps://www.fda.gov/media/152162/download  This test is not yet approved or cleared by the [place] FDA and has been authorized for detection and/or diagnosis of SARS-CoV-2 by FDA under an Emergency Use Authorization (EUA). This EUA will remain in effect (meaning this test can be used) for the duration of the COVID-19 declaration under Section 564(b)(1) of the Act, 21 U.S.C. section 360bbb-3(b)(1), unless the authorization is terminated or revoked.  Performed at [facility], [address] [place], [place] [address]    No  results found.    Assessment/Plan Hx of PE  Left breast abscess - S/p I&D in the ED on 3/14 - Agree with continuing abx  - No indication for surgical drainage at this  time. Change dressing once daily: loosely pack with gauze, apply dry gauze on top and secure with tape. Shower with wound open. - I think she can be discharged home on oral antibiotics once pain is controlled. - We will follow with you   FEN - reg diet VTE - SCDs, heparin subq ID - vanc 3/16 >>  Foley - None Follow-Up - [facility]  Franne Forts, [facility] 05/05/2020, 3:51 PM Please see Amion for pager number during day hours 7:00am-4:30pm

## 2020-05-05 NOTE — Progress Notes (Signed)
Pt arrived to the unit. Vital signs stable. In no acute distress. Pt complains of pain to left chest area under breast. Will continue to monitor.

## 2020-05-05 NOTE — Progress Notes (Signed)
Pharmacy Antibiotic Note  Vicki Mays is a 30 y.o. female admitted on 05/05/2020 with cellulitis.  Pharmacy has been consulted for vancomycin dosing. Pt is afebrile and WBC is 10.8. SCr is WNL. Pt was prescribed doxycycline a few days ago but has been unable to take.  Plan: Vancomycin 2g IV x 1 then 1250mg  IV Q12H F/u renal fxn, C&S, clinical status and peak/trough at SS  Height: 5\' 2"  (157.5 cm) Weight: 133 kg (293 lb 3.4 oz) IBW/kg (Calculated) : 50.1  Temp (24hrs), Avg:97.9 F (36.6 C), Min:97.9 F (36.6 C), Max:97.9 F (36.6 C)  Recent Labs  Lab 05/03/20 1013  WBC 14.0*  CREATININE 0.72    Estimated Creatinine Clearance: 138.9 mL/min (by C-G formula based on SCr of 0.72 mg/dL).    Allergies  Allergen Reactions  . Amoxicillin Swelling    Antimicrobials this admission: Vanc 3/16>>  Dose adjustments this admission: N/A  Microbiology results: Pending  Thank you for allowing pharmacy to be a part of this patient's care.  [place], 05/05/2020 9:20 AM

## 2020-05-05 NOTE — H&P (Signed)
History and Physical    Vicki Mays GYI:[id number] DOB: 1990-08-19 DOA: 05/05/2020  PCP: Patient, No Pcp Per Patient coming from: Home  Chief Complaint: Left breast pain  HPI: Vicki Mays is a 30 y.o. female with medical history significant of pulmonary embolism no longer on anticoagulation, on birth control transferred from [facility] for persistent left-sided breast pain.  Patient has been having left-sided breast pain for the past week and went to [facility] ED on 3/14.  She was diagnosed with abscess requiring I&D, sent home on oral doxycycline and pain medication.  Patient continued to feel nauseous worsening of pain, fevers and chills therefore return back to the ER.  Patient was transferred to [facility] for further management.  During my evaluation at bedside she did report of left-sided pain under her breast.  I performed exam on her with patient's RN as chaperone.  General surgery was consulted.  Started on IV vancomycin and admitted to the hospital under observation.  Review of Systems: As per HPI otherwise 10 point review of systems negative.  Review of Systems Otherwise negative except as per HPI, including: General: Denies  night sweats or unintended weight loss. Resp: Denies cough, wheezing, shortness of breath. Cardiac: Denies palpitations, orthopnea, paroxysmal nocturnal dyspnea. GI: Denies abdominal pain, nausea, vomiting, diarrhea or constipation GU: Denies dysuria, frequency, hesitancy or incontinence MS: Denies muscle aches, joint pain or swelling Neuro: Denies headache, neurologic deficits (focal weakness, numbness, tingling), abnormal gait Psych: Denies anxiety, depression, SI/HI/AVH Skin: Denies new rashes or lesions ID: Denies sick contacts, exotic exposures, travel  Past Medical History:  Diagnosis Date  . Medical history non-contributory   . PE (pulmonary thromboembolism) (HCC)     Past Surgical History:  Procedure Laterality Date  . EYE SURGERY       SOCIAL HISTORY:  reports that she has never smoked. She has never used smokeless tobacco. She reports that she does not drink alcohol and does not use drugs.  Allergies  Allergen Reactions  . Amoxicillin Swelling    FAMILY HISTORY: No family history on file.   Prior to Admission medications   Medication Sig Start Date End Date Taking? Authorizing Provider  cyclobenzaprine (FLEXERIL) 5 MG tablet PLEASE SEE ATTACHED FOR DETAILED DIRECTIONS 02/24/20   [provider]  doxycycline (VIBRAMYCIN) 100 MG capsule Take 1 capsule (100 mg total) by mouth 2 (two) times daily. 05/03/20   Alvira Monday, MD  HYDROcodone-acetaminophen (NORCO/VICODIN) 5-325 MG tablet Take 1 tablet by mouth every 4 (four) hours as needed. 05/03/20   Alvira Monday, MD  LOW-OGESTREL 0.3-30 MG-MCG tablet Take by mouth. 04/14/20   [provider]    Physical Exam: Vitals:   05/05/20 1030 05/05/20 1200 05/05/20 1300 05/05/20 1530  BP: 119/83 113/73 115/72 122/80  Pulse: 83 85 (!) 52 71  Resp: 16  16 16   Temp:    98.9 F (37.2 C)  TempSrc:    Oral  SpO2: 99% 96% 97% 98%  Weight:      Height:          Constitutional: NAD, calm, comfortable, morbid obesity. Eyes: PERRL, lids and conjunctivae normal ENMT: Mucous membranes are moist. Posterior pharynx clear of any exudate or lesions.Normal dentition.  Neck: normal, supple, no masses, no thyromegaly Respiratory: clear to auscultation bilaterally, no wheezing, no crackles. Normal respiratory effort. No accessory muscle use.  Cardiovascular: Regular rate and rhythm, no murmurs / rubs / gallops. No extremity edema. 2+ pedal pulses. No carotid bruits.  Abdomen: no tenderness,  no masses palpated. No hepatosplenomegaly. Bowel sounds positive.  Musculoskeletal: no clubbing / cyanosis. No joint deformity upper and lower extremities. Good ROM, no contractures. Normal muscle tone.  Skin: Open area with clear drainage under her left breast with  surrounding fluctuant fullness, tender to palpation. Neurologic: CN 2-12 grossly intact. Sensation intact, DTR normal. Strength 5/5 in all 4.  Psychiatric: Normal judgment and insight. Alert and oriented x 3. Normal mood.   Chaperone, patient's RN, present during my evaluation and breast examination.  Labs on Admission: I have personally reviewed following labs and imaging studies  CBC: Recent Labs  Lab 05/03/20 1013 05/05/20 0935  WBC 14.0* 10.8*  NEUTROABS 9.5* 6.4  HGB 11.9* 12.1  HCT 39.8 41.4  MCV 73.2* 75.0*  PLT 329 338   Basic Metabolic Panel: Recent Labs  Lab 05/03/20 1013 05/05/20 0935  NA 137 138  K 3.6 3.3*  CL 104 104  CO2 22 25  GLUCOSE 96 108*  BUN 6 <5*  CREATININE 0.72 0.73  CALCIUM 8.8* 8.9   GFR: Estimated Creatinine Clearance: 138.9 mL/min (by C-G formula based on SCr of 0.73 mg/dL). Liver Function Tests: Recent Labs  Lab 05/03/20 1013 05/05/20 0935  AST 31 35  ALT 65* 69*  ALKPHOS 59 57  BILITOT 0.3 0.1*  PROT 8.0 8.2*  ALBUMIN 3.3* 3.3*   No results for input(s): LIPASE, AMYLASE in the last 168 hours. No results for input(s): AMMONIA in the last 168 hours. Coagulation Profile: No results for input(s): INR, PROTIME in the last 168 hours. Cardiac Enzymes: No results for input(s): CKTOTAL, CKMB, CKMBINDEX, TROPONINI in the last 168 hours. BNP (last 3 results) No results for input(s): PROBNP in the last 8760 hours. HbA1C: No results for input(s): HGBA1C in the last 72 hours. CBG: No results for input(s): GLUCAP in the last 168 hours. Lipid Profile: No results for input(s): CHOL, HDL, LDLCALC, TRIG, CHOLHDL, LDLDIRECT in the last 72 hours. Thyroid Function Tests: No results for input(s): TSH, T4TOTAL, FREET4, T3FREE, THYROIDAB in the last 72 hours. Anemia Panel: No results for input(s): VITAMINB12, FOLATE, FERRITIN, TIBC, IRON, RETICCTPCT in the last 72 hours. Urine analysis:    Component Value Date/Time   COLORURINE AMBER (A)  05/05/2020 0935   APPEARANCEUR HAZY (A) 05/05/2020 0935   LABSPEC >1.030 (H) 05/05/2020 0935   PHURINE 6.0 05/05/2020 0935   GLUCOSEU NEGATIVE 05/05/2020 0935   HGBUR NEGATIVE 05/05/2020 0935   BILIRUBINUR SMALL (A) 05/05/2020 0935   KETONESUR NEGATIVE 05/05/2020 0935   PROTEINUR NEGATIVE 05/05/2020 0935   UROBILINOGEN 0.2 08/13/2014 2215   NITRITE NEGATIVE 05/05/2020 0935   LEUKOCYTESUR NEGATIVE 05/05/2020 0935   Sepsis Labs: !!!!!!!!!!!!!!!!!!!!!!!!!!!!!!!!!!!!!!!!!!!! @LABRCNTIP (procalcitonin:4,lacticidven:4) ) Recent Results (from the past 240 hour(s))  Resp Panel by RT-PCR (Flu A&B, Covid) Nasopharyngeal Swab     Status: None   Collection Time: 05/05/20  9:35 AM   Specimen: Nasopharyngeal Swab; Nasopharyngeal(NP) swabs in vial transport medium  Result Value Ref Range Status   SARS Coronavirus 2 by RT PCR NEGATIVE NEGATIVE Final    Comment: (NOTE) SARS-CoV-2 target nucleic acids are NOT DETECTED.  The SARS-CoV-2 RNA is generally detectable in upper respiratory specimens during the acute phase of infection. The lowest concentration of SARS-CoV-2 viral copies this assay can detect is 138 copies/mL. A negative result does not preclude SARS-Cov-2 infection and should not be used as the sole basis for treatment or other patient management decisions. A negative result may occur with  improper specimen collection/handling, submission of specimen other  than nasopharyngeal swab, presence of viral mutation(s) within the areas targeted by this assay, and inadequate number of viral copies(<138 copies/mL). A negative result must be combined with clinical observations, patient history, and epidemiological information. The expected result is Negative.  Fact Sheet for Patients:  BloggerCourse.com  Fact Sheet for Healthcare Providers:  SeriousBroker.it  This test is no t yet approved or cleared by the [place] FDA and  has been  authorized for detection and/or diagnosis of SARS-CoV-2 by FDA under an Emergency Use Authorization (EUA). This EUA will remain  in effect (meaning this test can be used) for the duration of the COVID-19 declaration under Section 564(b)(1) of the Act, 21 U.S.C.section 360bbb-3(b)(1), unless the authorization is terminated  or revoked sooner.       Influenza A by PCR NEGATIVE NEGATIVE Final   Influenza B by PCR NEGATIVE NEGATIVE Final    Comment: (NOTE) The Xpert Xpress SARS-CoV-2/FLU/RSV plus assay is intended as an aid in the diagnosis of influenza from Nasopharyngeal swab specimens and should not be used as a sole basis for treatment. Nasal washings and aspirates are unacceptable for Xpert Xpress SARS-CoV-2/FLU/RSV testing.  Fact Sheet for Patients: BloggerCourse.com  Fact Sheet for Healthcare Providers: SeriousBroker.it  This test is not yet approved or cleared by the [place] FDA and has been authorized for detection and/or diagnosis of SARS-CoV-2 by FDA under an Emergency Use Authorization (EUA). This EUA will remain in effect (meaning this test can be used) for the duration of the COVID-19 declaration under Section 564(b)(1) of the Act, 21 U.S.C. section 360bbb-3(b)(1), unless the authorization is terminated or revoked.  Performed at [facility], [address] [place], [place] [address]      Radiological Exams on Admission: No results found.   All images have been reviewed by me personally.    Assessment/Plan Principal Problem:   Left breast abscess Active Problems:   Cellulitis   Uses birth control   Obesity, Class III, BMI 40-49.9 (morbid obesity) (HCC)   Microcytic anemia   Hypokalemia   Left-sided breast pain/abscess with surrounding cellulitis -Status post I&D on 3/14 in the ER but returned due to persistence of symptoms and inability to tolerate oral antibiotics. -Antiemetics,  cultures done -Empiric IV vancomycin -General surgery consulted -Pain control, bowel regimen  Microcytic anemia -Iron studies, B12 and folate ordered  Hypokalemia -Repletion ordered  Currently patient is on birth control Previous history of pulmonary embolism -No longer on blood thinners.   DVT prophylaxis: Subcu heparin Code Status: Full code Family Communication: None at bedside Consults called: General surgery Admission status: Observation  Status is: Observation  The patient remains OBS appropriate and will d/c before 2 midnights.  Dispo: The patient is from: Home              Anticipated d/c is to: Home              Patient currently is not medically stable to d/c.   Difficult to place patient No       Time Spent: 65 minutes.  >50% of the time was devoted to discussing the patients care, assessment, plan and disposition with other care givers along with counseling the patient about the risks and benefits of treatment.    Ainsley Deakins Joline Maxcy MD [facility]  If 7PM-7AM, please contact night-coverage   05/05/2020, 4:05 PM

## 2020-05-05 NOTE — Plan of Care (Signed)
  Problem: Education: Goal: Knowledge of General Education information will improve Description: Including pain rating scale, medication(s)/side effects and non-pharmacologic comfort measures Outcome: Progressing   Problem: Health Behavior/Discharge Planning: Goal: Ability to manage health-related needs will improve Outcome: Progressing   Problem: Nutrition: Goal: Adequate nutrition will be maintained Outcome: Progressing   Problem: Pain Managment: Goal: General experience of comfort will improve Outcome: Progressing   Problem: Safety: Goal: Ability to remain free from injury will improve Outcome: Progressing   

## 2020-05-05 NOTE — ED Notes (Signed)
Pt aware of need to provide urine specimen, unable to provide at this time.

## 2020-05-05 NOTE — ED Triage Notes (Signed)
Pt seen Monday for I&D area left breast.  Was prescribed antibiotics and pain medication upon discharge.  States was unable to start medication due to n/v/d.  Denies fever

## 2020-05-05 NOTE — Progress Notes (Signed)
Notified by [facility]) EDP that patient will need admission for left breast cellulitis and possible abscess. Informed that General Surgery (Dr. Michaell Cowing) has been consulted and requests admission to [facility]) as their breast specialists are at that facility. [facility] has agreed to admission to [facility]. While patient is holding at [facility], she will remain under the direct care of Dr. Dalene Seltzer. All orders, labs results will continue to be directed to that staff. Upon arrival to [facility], nursing will need to notify the flow manager that the patient has arrived so that the appropriate [facility] member may see the patient. Nursing will also notify General Surgery of her arrival. Thank you.

## 2020-05-05 NOTE — ED Provider Notes (Signed)
[facility] EMERGENCY DEPARTMENT Provider Note   CSN: [id number] Arrival date & time: 05/05/20  0820     History Chief Complaint  Patient presents with  . Nausea    Vicki Mays is a 30 y.o. female.  HPI      29yo female with history of PE, evaluation 2 days ago found to have left breast abscess and cellulitis presents with concern for nausea, vomiting and diarrhea with inability to take antibiotics prescribed and continuing chest wall pain.  Reports since the drainage she has had less pain in the area but she still has pain.  No fevers.   Monday night she developed nausea, vomiting and diarrhea and has been either throwing up or having diarrhea with everything she attempts to eat. She has been unable to take the antibiotics. Only had one dose of norco.  Has lower abdominal cramping pain. No known sick contacts. No cough, shortness of breath, sore throat, urinary symptoms.     Past Medical History:  Diagnosis Date  . Medical history non-contributory   . PE (pulmonary thromboembolism) [facility])     Patient Active Problem List   Diagnosis Date Noted  . Cellulitis 05/05/2020  . Left breast abscess 05/05/2020  . Uses birth control 05/05/2020  . Obesity, Class III, BMI 40-49.9 (morbid obesity) (HCC) 05/05/2020  . Microcytic anemia 05/05/2020  . Hypokalemia 05/05/2020  . Abdominal pain affecting pregnancy   . Encounter for fetal anatomic survey   . Amblyopia, both eyes 02/27/2013  . Refractive error 11/19/2012  . 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 EYE SURGERY       OB History    Gravida  1   Para  0   Term  0   Preterm  0   AB  0   Living  0     SAB  0   IAB  0   Ectopic  0   Multiple  0   Live Births              History reviewed. No pertinent family history.  Social History   Tobacco Use  . Smoking status: Never Smoker  . Smokeless tobacco: Never Used  Substance Use  Topics  . Alcohol use: No  . Drug use: No    Home Medications Prior to Admission medications   Medication Sig Start Date End Date Taking? Authorizing Provider  cyclobenzaprine (FLEXERIL) 5 MG tablet PLEASE SEE ATTACHED FOR DETAILED DIRECTIONS 02/24/20   [provider]  doxycycline (VIBRAMYCIN) 100 MG capsule Take 1 capsule (100 mg total) by mouth 2 (two) times daily. 05/03/20   Alvira Monday, MD  HYDROcodone-acetaminophen (NORCO/VICODIN) 5-325 MG tablet Take 1 tablet by mouth every 4 (four) hours as needed. 05/03/20   Alvira Monday, MD  LOW-OGESTREL 0.3-30 MG-MCG tablet Take by mouth. 04/14/20   [provider]    Allergies    Amoxicillin  Review of Systems   Review of Systems  Physical Exam Updated Vital Signs BP 124/72 (BP Location: Left Arm)   Pulse 83   Temp 98.3 F (36.8 C) (Oral)   Resp 18   Ht 5\' 2"  (1.575 m)   Wt 133 kg   LMP 04/19/2020   SpO2 95%   BMI 53.63 kg/m   Physical Exam Vitals and nursing note reviewed.  Constitutional:      General: She is not in acute distress.    Appearance: She is well-developed. She is  not diaphoretic.  HENT:     Head: Normocephalic and atraumatic.  Eyes:     Conjunctiva/sclera: Conjunctivae normal.  Cardiovascular:     Rate and Rhythm: Normal rate and regular rhythm.  Pulmonary:     Effort: Pulmonary effort is normal. No respiratory distress.  Chest:     Chest wall: Tenderness (13cm x 8cm area induration and tenderness lower left breast) present.  Abdominal:     General: There is no distension.     Palpations: Abdomen is soft.     Tenderness: There is no abdominal tenderness. There is no guarding.  Musculoskeletal:        General: No tenderness.     Cervical back: Normal range of motion.  Skin:    General: Skin is warm and dry.     Findings: No erythema or rash.  Neurological:     Mental Status: She is alert and oriented to person, place, and time.     ED Results / Procedures / Treatments    Labs (all labs ordered are listed, but only abnormal results are displayed) Labs Reviewed  URINALYSIS, ROUTINE W REFLEX MICROSCOPIC - Abnormal; Notable for the following components:      Result Value   Color, Urine AMBER (*)    APPearance HAZY (*)    Specific Gravity, Urine >1.030 (*)    Bilirubin Urine SMALL (*)    All other components within normal limits  CBC WITH DIFFERENTIAL/PLATELET - Abnormal; Notable for the following components:   WBC 10.8 (*)    RBC 5.52 (*)    MCV 75.0 (*)    MCH 21.9 (*)    MCHC 29.2 (*)    RDW 17.2 (*)    All other components within normal limits  COMPREHENSIVE METABOLIC PANEL - Abnormal; Notable for the following components:   Potassium 3.3 (*)    Glucose, Bld 108 (*)    BUN <5 (*)    Total Protein 8.2 (*)    Albumin 3.3 (*)    ALT 69 (*)    Total Bilirubin 0.1 (*)    All other components within normal limits  IRON AND TIBC - Abnormal; Notable for the following components:   Iron 20 (*)    Saturation Ratios 6 (*)    All other components within normal limits  VITAMIN B12 - Abnormal; Notable for the following components:   Vitamin B-12 140 (*)    All other components within normal limits  CBC - Abnormal; Notable for the following components:   Hemoglobin 11.3 (*)    MCV 75.0 (*)    MCH 22.2 (*)    MCHC 29.7 (*)    RDW 16.9 (*)    All other components within normal limits  RESP PANEL BY RT-PCR (FLU A&B, COVID) ARPGX2  CULTURE, BLOOD (ROUTINE X 2)  CULTURE, BLOOD (ROUTINE X 2)  URINE CULTURE  PREGNANCY, URINE  LACTIC ACID, PLASMA  LACTIC ACID, PLASMA  FERRITIN  TSH  HIV ANTIBODY (ROUTINE TESTING W REFLEX)  CREATININE, SERUM  FOLATE RBC  BASIC METABOLIC PANEL  CBC  MAGNESIUM    EKG None  Radiology No results found.  Procedures Procedures   Medications Ordered in ED Medications  vancomycin (VANCOREADY) IVPB 1250 mg/250 mL (has no administration in time range)  ondansetron (ZOFRAN) 4 MG/2ML injection (has no  administration in time range)  oxyCODONE (Oxy IR/ROXICODONE) immediate release tablet 5 mg (5 mg Oral Given 05/05/20 2023)  senna-docusate (Senokot-S) tablet 1 tablet (has no administration in time  range)  acetaminophen (TYLENOL) tablet 650 mg (has no administration in time range)  dextromethorphan-guaiFENesin (MUCINEX DM) 30-600 MG per 12 hr tablet 1 tablet (has no administration in time range)  loratadine (CLARITIN) tablet 10 mg (has no administration in time range)  lip balm (CARMEX) ointment 1 application (has no administration in time range)  polyvinyl alcohol (LIQUIFILM TEARS) 1.4 % ophthalmic solution 1 drop (has no administration in time range)  hydrocortisone (ANUSOL-HC) 2.5 % rectal cream 1 application (has no administration in time range)  alum & mag hydroxide-simeth (MAALOX/MYLANTA) 200-200-20 MG/5ML suspension 30 mL (has no administration in time range)  hydrocortisone cream 1 % 1 application (has no administration in time range)  Muscle Rub CREA 1 application (has no administration in time range)  phenol (CHLORASEPTIC) mouth spray 1 spray (has no administration in time range)  sodium chloride (OCEAN) 0.65 % nasal spray 2 spray (has no administration in time range)  heparin injection 5,000 Units (5,000 Units Subcutaneous Not Given 05/05/20 1711)  morphine 2 MG/ML injection 2 mg (has no administration in time range)  magnesium citrate solution 1 Bottle (has no administration in time range)  hydrALAZINE (APRESOLINE) injection 10 mg (has no administration in time range)  ondansetron (ZOFRAN) injection 4 mg (4 mg Intravenous Given 05/05/20 2046)  sodium chloride 0.9 % bolus 1,000 mL (0 mLs Intravenous Stopped 05/05/20 1050)  ondansetron (ZOFRAN) injection 4 mg (4 mg Intravenous Given 05/05/20 0948)  vancomycin (VANCOCIN) IVPB 1000 mg/200 mL premix (0 mg Intravenous Stopped 05/05/20 1154)  morphine 4 MG/ML injection 4 mg (4 mg Intravenous Given 05/05/20 0957)  ondansetron (ZOFRAN) injection 4  mg (4 mg Intravenous Given 05/05/20 1432)  potassium chloride SA (KLOR-CON) CR tablet 40 mEq (40 mEq Oral Given 05/05/20 1711)    ED Course  I have reviewed the triage vital signs and the nursing notes.  Pertinent labs & imaging results that were available during my care of the patient were reviewed by me and considered in my medical decision making (see chart for details).    MDM Rules/Calculators/A&P                          29yo female with history of PE, evaluation 2 days ago found to have left breast abscess and cellulitis presents with concern for nausea, vomiting and diarrhea with inability to take antibiotics prescribed and continuing chest wall pain.  Suspect likely viral etiology of nausea, vomiting and diarrhea.  Abdominal exam benign and doubt appendicitis, diverticulitis at this time.  She has been unable to take the antibiotics and given persistence and appearance of worsening infection feel admission for IV antibiotics most appropriate.  Suspect most likely cellulitis but difficult excluding cellulitis versus deeper abscess on exam and bedside US.  Discussed with IR who recommends general surgery consult/referral and discussed with breast radiologist and General Surgery, Dr. Michaell Cowing.  Given her vomiting and diarrhea and worsening cellulitis with possible abscess I do feel she requires admission for IV antibiotics (rather than discharge to breast center consult.)  Admitted to hospitalist for IV abx with plan for general surgery consultation.    Final Clinical Impression(s) / ED Diagnoses Final diagnoses:  Cellulitis of chest wall  Nausea vomiting and diarrhea    Rx / DC Orders ED Discharge Orders    None       Alvira Monday, MD 05/05/20 2231

## 2020-05-06 DIAGNOSIS — E876 Hypokalemia: Secondary | ICD-10-CM | POA: Diagnosis not present

## 2020-05-06 DIAGNOSIS — D509 Iron deficiency anemia, unspecified: Secondary | ICD-10-CM

## 2020-05-06 DIAGNOSIS — R112 Nausea with vomiting, unspecified: Secondary | ICD-10-CM | POA: Diagnosis not present

## 2020-05-06 DIAGNOSIS — N611 Abscess of the breast and nipple: Secondary | ICD-10-CM

## 2020-05-06 DIAGNOSIS — L03313 Cellulitis of chest wall: Secondary | ICD-10-CM | POA: Diagnosis not present

## 2020-05-06 LAB — BASIC METABOLIC PANEL
Anion gap: 9 (ref 5–15)
BUN: <5 mg/dL — ABNORMAL LOW (ref 6–20)
CO2: 22 mmol/L (ref 22–32)
Calcium: 8.7 mg/dL — ABNORMAL LOW (ref 8.9–10.3)
Chloride: 109 mmol/L (ref 98–111)
Creatinine, Ser: 1.1 mg/dL — ABNORMAL HIGH (ref 0.44–1.00)
GFR, Estimated: >60 mL/min (ref >60)
Glucose, Bld: 103 mg/dL — ABNORMAL HIGH (ref 70–99)
Potassium: 3.9 mmol/L (ref 3.5–5.1)
Sodium: 140 mmol/L (ref 135–145)

## 2020-05-06 LAB — FOLATE RBC
Folate, Hemolysate: 330 ng/mL
Folate, RBC: 892 ng/mL (ref >498)
Hematocrit: 37 % (ref 34.0–46.6)

## 2020-05-06 LAB — URINE CULTURE: Culture: NO GROWTH

## 2020-05-06 LAB — CBC
HCT: 36.7 % (ref 36.0–46.0)
Hemoglobin: 10.8 g/dL — ABNORMAL LOW (ref 12.0–15.0)
MCH: 21.8 pg — ABNORMAL LOW (ref 26.0–34.0)
MCHC: 29.4 g/dL — ABNORMAL LOW (ref 30.0–36.0)
MCV: 74 fL — ABNORMAL LOW (ref 80.0–100.0)
Platelets: 317 10*3/uL (ref 150–400)
RBC: 4.96 MIL/uL (ref 3.87–5.11)
RDW: 17.2 % — ABNORMAL HIGH (ref 11.5–15.5)
WBC: 8.9 10*3/uL (ref 4.0–10.5)
nRBC: 0 % (ref 0.0–0.2)

## 2020-05-06 LAB — MAGNESIUM: Magnesium: 2.1 mg/dL (ref 1.7–2.4)

## 2020-05-06 MED ORDER — OXYCODONE HCL 10 MG PO TABS: 10.0000 mg | ORAL_TABLET | ORAL | 0 refills | Status: DC | PRN

## 2020-05-06 MED ORDER — DEXTROSE 5 % IV SOLN
1500.0000 mg | Freq: Once | INTRAVENOUS | Status: AC
  Administered 2020-05-06: 1500 mg via INTRAVENOUS
  Filled 2020-05-06: qty 75

## 2020-05-06 MED ORDER — OXYCODONE HCL 5 MG PO TABS
10.0000 mg | ORAL_TABLET | ORAL | Status: DC | PRN
Start: 2020-05-06 — End: 2020-05-06
  Administered 2020-05-06: 10 mg via ORAL
  Filled 2020-05-06: qty 2

## 2020-05-06 MED ORDER — ONDANSETRON 4 MG PO TBDP: 4.0000 mg | ORAL_TABLET | Freq: Three times a day (TID) | ORAL | 0 refills | Status: DC | PRN

## 2020-05-06 NOTE — Progress Notes (Signed)
Nsg Discharge Note  Admit Date:  05/05/2020 Discharge date: 05/06/2020   [place]ia Dom to be D/C'd  per MD order.  AVS completed.   Patient/caregiver able to verbalize understanding.  Discharge Medication: Allergies as of 05/06/2020      Reactions   Amoxicillin Swelling      Medication List    STOP taking these medications   doxycycline 100 MG capsule Commonly known as: VIBRAMYCIN   HYDROcodone-acetaminophen 5-325 MG tablet Commonly known as: NORCO/VICODIN     TAKE these medications   Low-Ogestrel 0.3-30 MG-MCG tablet Generic drug: norgestrel-ethinyl estradiol Take by mouth.   ondansetron 4 MG disintegrating tablet Commonly known as: Zofran ODT Take 1 tablet (4 mg total) by mouth every 8 (eight) hours as needed for nausea or vomiting.   Oxycodone HCl 10 MG Tabs Take 1 tablet (10 mg total) by mouth every 4 (four) hours as needed for moderate pain or severe pain.            Discharge Care Instructions  (From admission, onward)         Start     Ordered   05/06/20 0000  Discharge wound care:       Comments: Change left breast dressing once daily: loosely pack with gauze, apply dry gauze on top and secure with tape. Shower with wound open   05/06/20 1544          Discharge Assessment: Vitals:   05/05/20 2009 05/06/20 0355  BP: 124/72 124/84  Pulse: 83 87  Resp: 18 18  Temp: 98.3 F (36.8 C) 98.7 F (37.1 C)  SpO2: 95% 100%   Skin clean, dry and intact without evidence of skin break down, no evidence of skin tears noted. IV catheter discontinued intact. Site without signs and symptoms of complications - no redness or edema noted at insertion site, patient denies c/o pain - only slight tenderness at site.  Dressing with slight pressure applied.  D/c Instructions-Education: Discharge instructions given to patient/family with verbalized understanding. D/c education completed with patient/family including follow up instructions, medication list, d/c  activities limitations if indicated, with other d/c instructions as indicated by MD - patient able to verbalize understanding, all questions fully answered. Patient instructed to return to ED, call 911, or call MD for any changes in condition.  Patient escorted via WC, and D/C home via private auto.  Aaliyah Cancro, Tilford Pillar, RN 05/06/2020 5:24 PM

## 2020-05-06 NOTE — Consult Note (Signed)
[facility]    Date of Admission:  05/05/2020     Reason for Consult: Breast abscess     Referring Physician: Dr Kerry Hough  Current antibiotics: Day 2 Vancomycin (3/16--present)    Principal Problem:   Left breast abscess Active Problems:   Cellulitis   Uses birth control   Obesity, Class III, BMI 40-49.9 (morbid obesity) (HCC)   Microcytic anemia   Hypokalemia   ASSESSMENT:    1. Left breast abscess and cellulitis s/p I&D 05/03/20: Discharged from ED at that time with PO doxycycline however returned due to persistent nausea and decreased PO intake.  Admitted and started on vancomycin with some early clinical improvement.  Has been evaluated by general surgery and currently no plans for further drainage 2. Hx of hidradenitis suppurativa 3. Hx of PE  PLAN:    . Continue antibiotics.  Will give her a dose of dalbavancin 1500mg  IV x 1 here today . Once that is done she can be discharged from our perspective pending anything further needed from surgical or hospitalist standpoint . She has follow up scheduled next week with [facility] and surgery for close follow up  HPI:    Vicki Mays is a 30 y.o. female with a prior history of hidradenitis suppurativa, prior gonorrhea/chlamydia infection, and PE not on anticoagulation who was transferred last evening from [facility] for left sided breast pain.  She began having pain approximately 1 week prior and presented to the ED on 3/14.  Diagnosed with abscess and had I&D with [facility] showing abscess and I&D revealing purulent fluid.  CT was negative for a deeper process.  Discharged home on PO doxycycline with pain control.  She was having continued pain and significant nausea which prompted her to present again for further evaluation last night.  She was subsequently started on vancomycin and transferred to [facility] for further management.  She also reported fevers and chills in addition to the pain under her left breast.  She was  seen by general surgery this morning who recommended continued IV antibiotics and outpatient follow up but no further I&D or debridement.  She reports to me that she had a prior breast abscess several months ago that resolved on its own with out antibiotic or intervention.  Otherwise she has no history of breast infections per her report.  However, it should be noted that she has a history of right axilla hidradenitis previously evaluated in 12/2015 at [facility] by Dr [facility].  At that time she was referred to plastic surgery for evaluation of axillary excisions.   Since admission she has been afebrile and initially elevated WBC has normalized to 8.9.  She has mild elevation of her ALT in the 60's (previously normal in 02/2020).   Past Medical History:  Diagnosis Date  . Medical history non-contributory   . PE (pulmonary thromboembolism) (HCC)     Social History   Tobacco Use  . Smoking status: Never Smoker  . Smokeless tobacco: Never Used  Substance Use Topics  . Alcohol use: No  . Drug use: No    History reviewed. No pertinent family history.  Allergies  Allergen Reactions  . Amoxicillin Swelling    Review of Systems  Constitutional: Positive for chills, fever and malaise/fatigue.  Respiratory: Negative.   Cardiovascular: Negative.   Gastrointestinal: Positive for nausea.  Genitourinary: Negative.   Musculoskeletal: Negative.   Skin:       Left breast wound.   All other systems reviewed and  are negative.   OBJECTIVE:   Blood pressure 124/84, pulse 87, temperature 98.7 F (37.1 C), temperature source Oral, resp. rate 18, height 5\' 2"  (1.575 m), weight 133 kg, last menstrual period 04/19/2020, SpO2 100 %, unknown if currently breastfeeding. Body mass index is 53.63 kg/m.  Physical Exam Constitutional:      General: She is not in acute distress.    Appearance: Normal appearance.  HENT:     Head: Normocephalic and atraumatic.     Nose: Nose normal.     Mouth/Throat:      Mouth: Mucous membranes are moist.     Pharynx: Oropharynx is clear.  Eyes:     Extraocular Movements: Extraocular movements intact.     Conjunctiva/sclera: Conjunctivae normal.  Pulmonary:     Effort: Pulmonary effort is normal. No respiratory distress.  Abdominal:     General: There is no distension.     Palpations: Abdomen is soft.  Musculoskeletal:     Cervical back: Normal range of motion and neck supple.     Right lower leg: Edema present.     Left lower leg: No edema.  Skin:    General: Skin is warm and dry.     Findings: Erythema and lesion present.  Neurological:     General: No focal deficit present.     Mental Status: She is alert and oriented to person, place, and time.  Psychiatric:        Mood and Affect: Mood normal.        Behavior: Behavior normal.           Lab Results: Lab Results  Component Value Date   WBC 8.9 05/06/2020   HGB 10.8 (L) 05/06/2020   HCT 36.7 05/06/2020   MCV 74.0 (L) 05/06/2020   PLT 317 05/06/2020    Lab Results  Component Value Date   NA 140 05/06/2020   K 3.9 05/06/2020   CO2 22 05/06/2020   GLUCOSE 103 (H) 05/06/2020   BUN <5 (L) 05/06/2020   CREATININE 1.10 (H) 05/06/2020   CALCIUM 8.7 (L) 05/06/2020   GFRNONAA >60 05/06/2020   GFRAA >60 08/14/2014    Lab Results  Component Value Date   ALT 69 (H) 05/05/2020   AST 35 05/05/2020   ALKPHOS 57 05/05/2020   BILITOT 0.1 (L) 05/05/2020    No results found for: CRP  No results found for: ESRSEDRATE  I have reviewed the micro and lab results in Epic.  Imaging: No results found.     05/07/2020 for Infectious Disease [facility] [phone] pager 05/06/2020, 3:05 PM

## 2020-05-06 NOTE — Discharge Instructions (Signed)
Cellulitis, Adult  Cellulitis is a skin infection. The infected area is usually warm, red, swollen, and tender. This condition occurs most often in the arms and lower legs. The infection can travel to the muscles, blood, and underlying tissue and become serious. It is very important to get treated for this condition. What are the causes? Cellulitis is caused by bacteria. The bacteria enter through a break in the skin, such as a cut, burn, insect bite, open sore, or crack. What increases the risk? This condition is more likely to occur in people who:  Have a weak body defense system (immune system).  Have open wounds on the skin, such as cuts, burns, bites, and scrapes. Bacteria can enter the body through these open wounds.  Are older than 28 years of age.  Have diabetes.  Have a type of long-lasting (chronic) liver disease (cirrhosis) or kidney disease.  Are obese.  Have a skin condition such as: ? Itchy rash (eczema). ? Slow movement of blood in the veins (venous stasis). ? Fluid buildup below the skin (edema).  Have had radiation therapy.  Use IV drugs. What are the signs or symptoms? Symptoms of this condition include:  Redness, streaking, or spotting on the skin.  Swollen area of the skin.  Tenderness or pain when an area of the skin is touched.  Warm skin.  A fever.  Chills.  Blisters. How is this diagnosed? This condition is diagnosed based on a medical history and physical exam. You may also have tests, including:  Blood tests.  Imaging tests. How is this treated? Treatment for this condition may include:  Medicines, such as antibiotic medicines or medicines to treat allergies (antihistamines).  Supportive care, such as rest and application of cold or warm cloths (compresses) to the skin.  Hospital care, if the condition is severe. The infection usually starts to get better within 1-2 days of treatment. Follow these instructions at  home: Medicines  Take over-the-counter and prescription medicines only as told by your health care provider.  If you were prescribed an antibiotic medicine, take it as told by your health care provider. Do not stop taking the antibiotic even if you start to feel better. General instructions  Drink enough fluid to keep your urine pale yellow.  Do not touch or rub the infected area.  Raise (elevate) the infected area above the level of your heart while you are sitting or lying down.  Apply warm or cold compresses to the affected area as told by your health care provider.  Keep all follow-up visits as told by your health care provider. This is important. These visits let your health care provider make sure a more serious infection is not developing.   Contact a health care provider if:  You have a fever.  Your symptoms do not begin to improve within 1-2 days of starting treatment.  Your bone or joint underneath the infected area becomes painful after the skin has healed.  Your infection returns in the same area or another area.  You notice a swollen bump in the infected area.  You develop new symptoms.  You have a general ill feeling (malaise) with muscle aches and pains. Get help right away if:  Your symptoms get worse.  You feel very sleepy.  You develop vomiting or diarrhea that persists.  You notice red streaks coming from the infected area.  Your red area gets larger or turns dark in color. These symptoms may represent a serious problem that is   an emergency. Do not wait to see if the symptoms will go away. Get medical help right away. Call your local emergency services (911 in the [place] Do not drive yourself to the hospital. Summary  Cellulitis is a skin infection. This condition occurs most often in the arms and lower legs.  Treatment for this condition may include medicines, such as antibiotic medicines or antihistamines.  Take over-the-counter and prescription  medicines only as told by your health care provider. If you were prescribed an antibiotic medicine, do not stop taking the antibiotic even if you start to feel better.  Contact a health care provider if your symptoms do not begin to improve within 1-2 days of starting treatment or your symptoms get worse.  Keep all follow-up visits as told by your health care provider. This is important. These visits let your health care provider make sure that a more serious infection is not developing. This information is not intended to replace advice given to you by your health care provider. Make sure you discuss any questions you have with your health care provider. Document Revised: 02/17/2019 Document Reviewed: 06/28/2017 Elsevier Patient Education  2021 [employer]   Wound Care, Adult Remove current dressing/packing.  Shower, or wash area with soap and water.  Dry the area.  Pack just the tip of the open area with a dry opened 2 x 2.  Place a dressing over this.  Keep the area under your breast clean and dry.  Do dressing changes 1-2 times per day.  The open site will heal in slowly and skin with return to the site as before.   Taking care of your wound properly can help to prevent pain, infection, and scarring. It can also help your wound heal more quickly. Follow instructions from your health care provider about how to care for your wound. Supplies needed:  Soap and water.  Wound cleanser.  Gauze.  If needed, a clean bandage (dressing) or other type of wound dressing material to cover or place in the wound. Follow your health care provider's instructions about what dressing supplies to use.  Cream or ointment to apply to the wound, if told by your health care provider. How to care for your wound Cleaning the wound Ask your health care provider how to clean the wound. This may include:  Using mild soap and water or a wound cleanser.  Using a clean gauze to pat the wound dry after cleaning  it. Do not rub or scrub the wound. Dressing care  Wash your hands with soap and water for at least 20 seconds before and after you change the dressing. If soap and water are not available, use hand sanitizer.  Change your dressing as told by your health care provider. This may include: ? Cleaning or rinsing out (irrigating) the wound. ? Placing a dressing over the wound or in the wound (packing). ? Covering the wound with an outer dressing.  Leave any stitches (sutures), skin glue, or adhesive strips in place. These skin closures may need to stay in place for 2 weeks or longer. If adhesive strip edges start to loosen and curl up, you may trim the loose edges. Do not remove adhesive strips completely unless your health care provider tells you to do that.  Ask your health care provider when you can leave the wound uncovered. Checking for infection Check your wound area every day for signs of infection. Check for:  More redness, swelling, or pain.  Fluid or blood.  Warmth.  Pus or a bad smell.   Follow these instructions at home Medicines  If you were prescribed an antibiotic medicine, cream, or ointment, take or apply it as told by your health care provider. Do not stop using the antibiotic even if your condition improves.  If you were prescribed pain medicine, take it 30 minutes before you do any wound care or as told by your health care provider.  Take over-the-counter and prescription medicines only as told by your health care provider. Eating and drinking  Eat a diet that includes protein, vitamin A, vitamin C, and other nutrient-rich foods to help the wound heal. ? Foods rich in protein include meat, fish, eggs, dairy, beans, and nuts. ? Foods rich in vitamin A include carrots and dark green, leafy vegetables. ? Foods rich in vitamin C include citrus fruits, tomatoes, broccoli, and peppers.  Drink enough fluid to keep your urine pale yellow. General instructions  Do not  take baths, swim, use a hot tub, or do anything that would put the wound underwater until your health care provider approves. Ask your health care provider if you may take showers. You may only be allowed to take sponge baths.  Do not scratch or pick at the wound. Keep it covered as told by your health care provider.  Return to your normal activities as told by your health care provider. Ask your health care provider what activities are safe for you.  Protect your wound from the sun when you are outside for the first 6 months, or for as long as told by your health care provider. Cover up the scar area or apply sunscreen that has an SPF of at least 30.  Do not use any products that contain nicotine or tobacco, such as cigarettes, e-cigarettes, and chewing tobacco. These may delay wound healing. If you need help quitting, ask your health care provider.  Keep all follow-up visits as told by your health care provider. This is important. Contact a health care provider if:  You received a tetanus shot and you have swelling, severe pain, redness, or bleeding at the injection site.  Your pain is not controlled with medicine.  You have any of these signs of infection: ? More redness, swelling, or pain around the wound. ? Fluid or blood coming from the wound. ? Warmth coming from the wound. ? Pus or a bad smell coming from the wound. ? A fever or chills.  You are nauseous or you vomit.  You are dizzy. Get help right away if:  You have a red streak of skin near the area around your wound.  Your wound has been closed with staples, sutures, skin glue, or adhesive strips and it begins to open up and separate.  Your wound is bleeding, and the bleeding does not stop with gentle pressure.  You have a rash.  You faint.  You have trouble breathing. These symptoms may represent a serious problem that is an emergency. Do not wait to see if the symptoms will go away. Get medical help right away.  Call your local emergency services (911 in the [place] Do not drive yourself to the hospital. Summary  Always wash your hands with soap and water for at least 20 seconds before and after changing your dressing.  Change your dressing as told by your health care provider.  To help with healing, eat foods that are rich in protein, vitamin A, vitamin C, and other nutrients.  Check your wound every day  for signs of infection. Contact your health care provider if you suspect that your wound is infected. This information is not intended to replace advice given to you by your health care provider. Make sure you discuss any questions you have with your health care provider. Document Revised: 11/22/2018 Document Reviewed: 11/22/2018 Elsevier Patient Education  2021 [employer].

## 2020-05-06 NOTE — Discharge Summary (Signed)
Physician Discharge Summary  Vicki Mays IRW:[id number] DOB: January 15, 1991 DOA: 05/05/2020  PCP: Patient, No Pcp Per  Admit date: 05/05/2020 Discharge date: 05/06/2020  Admitted From: home Disposition:  home  Recommendations for Outpatient Follow-up:  1. Follow up with PCP in 1-2 weeks 2. Please obtain BMP/CBC in one week 3. Outpatient follow-up with general surgery and infectious disease has been scheduled  Discharge Condition:stable CODE STATUS:full code Diet recommendation: heart healthy  Brief/Interim Summary: 30 year old female was initially seen in the emergency room on 3/14 was found to have a left-sided breast abscess that was lanced and drained.  She was discharged home on doxycycline and pain medications.  She had persistent nausea and came back to the emergency room and nausea.  Due to concerns for worsening cellulitis, she was admitted to the hospital.  She was seen by general surgery and it was not felt that she needed any further drainage or debridement.  She did not have elevated WBC count or fever.  She was seen by infectious disease and felt to be an appropriate candidate to receive a dose of dalbavancin.  She will be followed up by general surgery and infectious disease in the outpatient setting.  Her pain appears to be controlled with oral pain medications, she has not required any IV pain meds.  At this point, she is felt to be stable for discharge.  Discharge Diagnoses:  Principal Problem:   Left breast abscess Active Problems:   Cellulitis   Uses birth control   Obesity, Class III, BMI 40-49.9 (morbid obesity) (HCC)   Microcytic anemia   Hypokalemia    Discharge Instructions  Discharge Instructions    Ambulatory referral to Infectious Disease   Complete by: As directed    Cellulitis patient:  Received dalbavancin on 05/06/2020.   Diet - low sodium heart healthy   Complete by: As directed    Discharge wound care:   Complete by: As directed    Change left  breast dressing once daily: loosely pack with gauze, apply dry gauze on top and secure with tape. Shower with wound open   Increase activity slowly   Complete by: As directed      Allergies as of 05/06/2020      Reactions   Amoxicillin Swelling      Medication List    STOP taking these medications   doxycycline 100 MG capsule Commonly known as: VIBRAMYCIN   HYDROcodone-acetaminophen 5-325 MG tablet Commonly known as: NORCO/VICODIN     TAKE these medications   Low-Ogestrel 0.3-30 MG-MCG tablet Generic drug: norgestrel-ethinyl estradiol Take by mouth.   ondansetron 4 MG disintegrating tablet Commonly known as: Zofran ODT Take 1 tablet (4 mg total) by mouth every 8 (eight) hours as needed for nausea or vomiting.   Oxycodone HCl 10 MG Tabs Take 1 tablet (10 mg total) by mouth every 4 (four) hours as needed for moderate pain or severe pain.            Discharge Care Instructions  (From admission, onward)         Start     Ordered   05/06/20 0000  Discharge wound care:       Comments: Change left breast dressing once daily: loosely pack with gauze, apply dry gauze on top and secure with tape. Shower with wound open   05/06/20 1544          Follow-up Information    [facility] Go on 05/11/2020.   Specialty: General Surgery Why:  Your appointment is at 05/11/20 at 11:30am Please arrive 30 minutes prior to your appointment to check in and fill out paperwork. Bring photo ID and insurance information. Contact information: [address] [place] [phone]             Allergies  Allergen Reactions  . Amoxicillin Swelling    Consultations:  General surgery  Infectious disease   Procedures/Studies: CT Angio Chest PE W and/or Wo Contrast  Result Date: 05/03/2020 CLINICAL DATA:  30 year old female with left side chest pain, no known injury. EXAM: CT ANGIOGRAPHY CHEST WITH CONTRAST TECHNIQUE:  Multidetector CT imaging of the chest was performed using the standard protocol during bolus administration of intravenous contrast. Multiplanar CT image reconstructions and MIPs were obtained to evaluate the vascular anatomy. CONTRAST:  100mL OMNIPAQUE IOHEXOL 350 MG/ML SOLN COMPARISON:  CT Abdomen and Pelvis 03/11/2009. FINDINGS: Cardiovascular: Adequate contrast bolus timing in the pulmonary arterial tree. Mild respiratory motion. No pulmonary artery filling defect identified to the distal segmental branches. Upper lobe subsegmental branches also appear patent. Middle and lower lobe subsegmental and distal branches are not well evaluated due to combined motion and contrast timing. No cardiomegaly or pericardial effusion. Normal visible aorta. No calcified coronary artery atherosclerosis is evident. Mediastinum/Nodes: Negative. No mediastinal lymphadenopathy. Small volume residual thymus. Lungs/Pleura: Major airways are patent and both lungs appear clear. No pleural effusion or abnormal pulmonary opacity. Upper Abdomen: Stable and negative visible liver, spleen, pancreas, adrenal glands, kidneys and bowel in the upper abdomen. Musculoskeletal: No osseous abnormality identified. Superficial soft tissue swelling and stranding along the crease of the left lower breast in an area of 5-6 cm (series 4, image 65 and series 6, image 121). No soft tissue gas. No drainable fluid. Review of the MIP images confirms the above findings. IMPRESSION: 1. No pulmonary embolus identified, middle and lower lobe subsegmental and distal branches are obscured. 2. Superficial soft tissue swelling and stranding in a 5-6 cm area along the crease of the left lower breast compatible with cellulitis, phlegmon. No drainable fluid collection or soft tissue gas. 3. Otherwise negative CT appearance of the chest. Electronically Signed   By: Odessa FlemingH  Hall M.D.   On: 05/03/2020 10:41       Subjective: Overall nausea is better, she is tolerating her  diet, she not had any vomiting.  Pain appears to be controlled with oral pain medications, she has not required any IV pain meds  Discharge Exam: Vitals:   05/05/20 1300 05/05/20 1530 05/05/20 2009 05/06/20 0355  BP: 115/72 122/80 124/72 124/84  Pulse: (!) 52 71 83 87  Resp: 16 16 18 18   Temp:  98.9 F (37.2 C) 98.3 F (36.8 C) 98.7 F (37.1 C)  TempSrc:  Oral Oral Oral  SpO2: 97% 98% 95% 100%  Weight:      Height:        General: Pt is alert, awake, not in acute distress Cardiovascular: RRR, S1/S2 +, no rubs, no gallops Respiratory: CTA bilaterally, no wheezing, no rhonchi Abdominal: Soft, NT, ND, bowel sounds + Extremities: no edema, no cyanosis    The results of significant diagnostics from this hospitalization (including imaging, microbiology, ancillary and laboratory) are listed below for reference.     Microbiology: Recent Results (from the past 240 hour(s))  Blood culture (routine x 2)     Status: None (Preliminary result)   Collection Time: 05/05/20  9:34 AM   Specimen: BLOOD  Result Value Ref Range Status  Specimen Description   Final    BLOOD RIGHT ANTECUBITAL Performed at [facility], [address] [place], [place] [address]    Special Requests   Final    BOTTLES DRAWN AEROBIC AND ANAEROBIC Blood Culture adequate volume Performed at [facility], [address] [place], [place] [address]    Culture   Final    NO GROWTH 1 DAY Performed at [facility], [address] [place], [place] [address]    Report Status PENDING  Incomplete  Urine culture     Status: None   Collection Time: 05/05/20  9:35 AM   Specimen: Urine, Random  Result Value Ref Range Status   Specimen Description   Final    URINE, RANDOM Performed at [facility], [address] [place], [place] [address]    Special Requests   Final    NONE Performed at [facility], [address] [place], [place] [address]    Culture   Final     NO GROWTH Performed at [facility], [address], [place], [place] [address]    Report Status 05/06/2020 FINAL  Final  Resp Panel by RT-PCR (Flu A&B, Covid) Nasopharyngeal Swab     Status: None   Collection Time: 05/05/20  9:35 AM   Specimen: Nasopharyngeal Swab; Nasopharyngeal(NP) swabs in vial transport medium  Result Value Ref Range Status   SARS Coronavirus 2 by RT PCR NEGATIVE NEGATIVE Final    Comment: (NOTE) SARS-CoV-2 target nucleic acids are NOT DETECTED.  The SARS-CoV-2 RNA is generally detectable in upper respiratory specimens during the acute phase of infection. The lowest concentration of SARS-CoV-2 viral copies this assay can detect is 138 copies/mL. A negative result does not preclude SARS-Cov-2 infection and should not be used as the sole basis for treatment or other patient management decisions. A negative result may occur with  improper specimen collection/handling, submission of specimen other than nasopharyngeal swab, presence of viral mutation(s) within the areas targeted by this assay, and inadequate number of viral copies(<138 copies/mL). A negative result must be combined with clinical observations, patient history, and epidemiological information. The expected result is Negative.  Fact Sheet for Patients:  BloggerCourse.com  Fact Sheet for Healthcare Providers:  SeriousBroker.it  This test is no t yet approved or cleared by the [place] FDA and  has been authorized for detection and/or diagnosis of SARS-CoV-2 by FDA under an Emergency Use Authorization (EUA). This EUA will remain  in effect (meaning this test can be used) for the duration of the COVID-19 declaration under Section 564(b)(1) of the Act, 21 U.S.C.section 360bbb-3(b)(1), unless the authorization is terminated  or revoked sooner.       Influenza A by PCR NEGATIVE NEGATIVE Final   Influenza B by PCR NEGATIVE NEGATIVE Final     Comment: (NOTE) The Xpert Xpress SARS-CoV-2/FLU/RSV plus assay is intended as an aid in the diagnosis of influenza from Nasopharyngeal swab specimens and should not be used as a sole basis for treatment. Nasal washings and aspirates are unacceptable for Xpert Xpress SARS-CoV-2/FLU/RSV testing.  Fact Sheet for Patients: BloggerCourse.com  Fact Sheet for Healthcare Providers: SeriousBroker.it  This test is not yet approved or cleared by the [place] FDA and has been authorized for detection and/or diagnosis of SARS-CoV-2 by FDA under an Emergency Use Authorization (EUA). This EUA will remain in effect (meaning this test can be used) for the duration of  the COVID-19 declaration under Section 564(b)(1) of the Act, 21 U.S.C. section 360bbb-3(b)(1), unless the authorization is terminated or revoked.  Performed at [facility], [address] [place], [place] [address]   Blood culture (routine x 2)     Status: None (Preliminary result)   Collection Time: 05/05/20  9:40 AM   Specimen: BLOOD RIGHT ARM  Result Value Ref Range Status   Specimen Description   Final    BLOOD RIGHT ARM Performed at [facility], [address] [place], [place] [address]    Special Requests   Final    BOTTLES DRAWN AEROBIC AND ANAEROBIC Blood Culture adequate volume Performed at [facility], [address], [place], [place] [address]    Culture   Final    NO GROWTH 1 DAY Performed at [facility], [address], [place], [place] [address]    Report Status PENDING  Incomplete     Labs: BNP (last 3 results) No results for input(s): BNP in the last 8760 hours. Basic Metabolic Panel: Recent Labs  Lab 05/03/20 1013 05/05/20 0935 05/05/20 1640 05/06/20 0255  NA 137 138  --  140  K 3.6 3.3*  --  3.9  CL 104 104  --  109  CO2 22 25  --  22  GLUCOSE 96 108*  --  103*  BUN 6 <5*  --  <5*  CREATININE  0.72 0.73 0.80 1.10*  CALCIUM 8.8* 8.9  --  8.7*  MG  --   --   --  2.1   Liver Function Tests: Recent Labs  Lab 05/03/20 1013 05/05/20 0935  AST 31 35  ALT 65* 69*  ALKPHOS 59 57  BILITOT 0.3 0.1*  PROT 8.0 8.2*  ALBUMIN 3.3* 3.3*   No results for input(s): LIPASE, AMYLASE in the last 168 hours. No results for input(s): AMMONIA in the last 168 hours. CBC: Recent Labs  Lab 05/03/20 1013 05/05/20 0935 05/05/20 1640 05/06/20 0255  WBC 14.0* 10.8* 9.6 8.9  NEUTROABS 9.5* 6.4  --   --   HGB 11.9* 12.1 11.3* 10.8*  HCT 39.8 41.4 38.1  37.0 36.7  MCV 73.2* 75.0* 75.0* 74.0*  PLT 329 338 283 317   Cardiac Enzymes: No results for input(s): CKTOTAL, CKMB, CKMBINDEX, TROPONINI in the last 168 hours. BNP: Invalid input(s): POCBNP CBG: No results for input(s): GLUCAP in the last 168 hours. D-Dimer No results for input(s): DDIMER in the last 72 hours. Hgb A1c No results for input(s): HGBA1C in the last 72 hours. Lipid Profile No results for input(s): CHOL, HDL, LDLCALC, TRIG, CHOLHDL, LDLDIRECT in the last 72 hours. Thyroid function studies Recent Labs    05/05/20 1640  TSH 2.172   Anemia work up Recent Labs    05/05/20 1640  VITAMINB12 140*  FERRITIN 46  TIBC 357  IRON 20*   Urinalysis    Component Value Date/Time   COLORURINE AMBER (A) 05/05/2020 0935   APPEARANCEUR HAZY (A) 05/05/2020 0935   LABSPEC >1.030 (H) 05/05/2020 0935   PHURINE 6.0 05/05/2020 0935   GLUCOSEU NEGATIVE 05/05/2020 0935   HGBUR NEGATIVE 05/05/2020 0935   BILIRUBINUR SMALL (A) 05/05/2020 0935   KETONESUR NEGATIVE 05/05/2020 0935   PROTEINUR NEGATIVE 05/05/2020 0935   UROBILINOGEN 0.2 08/13/2014 2215   NITRITE NEGATIVE 05/05/2020 0935   LEUKOCYTESUR NEGATIVE 05/05/2020 0935   Sepsis Labs Invalid input(s): PROCALCITONIN,  WBC,  LACTICIDVEN Microbiology Recent Results (from the  past 240 hour(s))  Blood culture (routine x 2)     Status: None (Preliminary result)   Collection Time:  05/05/20  9:34 AM   Specimen: BLOOD  Result Value Ref Range Status   Specimen Description   Final    BLOOD RIGHT ANTECUBITAL Performed at [facility], [address] [place], [place] [address]    Special Requests   Final    BOTTLES DRAWN AEROBIC AND ANAEROBIC Blood Culture adequate volume Performed at [facility], [address] [place], [place] [address]    Culture   Final    NO GROWTH 1 DAY Performed at [facility], [address], [place], [place] [address]    Report Status PENDING  Incomplete  Urine culture     Status: None   Collection Time: 05/05/20  9:35 AM   Specimen: Urine, Random  Result Value Ref Range Status   Specimen Description   Final    URINE, RANDOM Performed at [facility], [address] [place], [place] [address]    Special Requests   Final    NONE Performed at [facility], [address] [place], [place] [address]    Culture   Final    NO GROWTH Performed at [facility], [address] [place] [place], [place] [address]    Report Status 05/06/2020 FINAL  Final  Resp Panel by RT-PCR (Flu A&B, Covid) Nasopharyngeal Swab     Status: None   Collection Time: 05/05/20  9:35 AM   Specimen: Nasopharyngeal Swab; Nasopharyngeal(NP) swabs in vial transport medium  Result Value Ref Range Status   SARS Coronavirus 2 by RT PCR NEGATIVE NEGATIVE Final    Comment: (NOTE) SARS-CoV-2 target nucleic acids are NOT DETECTED.  The SARS-CoV-2 RNA is generally detectable in upper respiratory specimens during the acute phase of infection. The lowest concentration of SARS-CoV-2 viral copies this assay can detect is 138 copies/mL. A negative result does not preclude SARS-Cov-2 infection and should not be used as the sole basis for treatment or other patient management decisions. A negative result may occur with  improper specimen collection/handling, submission of specimen other than nasopharyngeal swab, presence of  viral mutation(s) within the areas targeted by this assay, and inadequate number of viral copies(<138 copies/mL). A negative result must be combined with clinical observations, patient history, and epidemiological information. The expected result is Negative.  Fact Sheet for Patients:  BloggerCourse.com  Fact Sheet for Healthcare Providers:  SeriousBroker.it  This test is no t yet approved or cleared by the [place] FDA and  has been authorized for detection and/or diagnosis of SARS-CoV-2 by FDA under an Emergency Use Authorization (EUA). This EUA will remain  in effect (meaning this test can be used) for the duration of the COVID-19 declaration under Section 564(b)(1) of the Act, 21 U.S.C.section 360bbb-3(b)(1), unless the authorization is terminated  or revoked sooner.       Influenza A by PCR NEGATIVE NEGATIVE Final   Influenza B by PCR NEGATIVE NEGATIVE Final    Comment: (NOTE) The Xpert Xpress SARS-CoV-2/FLU/RSV plus assay is intended as an aid in the diagnosis of influenza from Nasopharyngeal swab specimens and should not be used as a sole basis for treatment. Nasal washings and aspirates are unacceptable for Xpert Xpress SARS-CoV-2/FLU/RSV testing.  Fact Sheet for Patients: BloggerCourse.com  Fact Sheet for Healthcare Providers: SeriousBroker.it  This test is not yet approved or cleared by the  [place] [employer] and has been authorized for detection and/or diagnosis of SARS-CoV-2 by FDA under an TEFL teacher (EUA). This EUA will remain in effect (meaning this test can be used) for the duration of the COVID-19 declaration under Section 564(b)(1) of the Act, 21 U.S.C. section 360bbb-3(b)(1), unless the authorization is terminated or revoked.  Performed at [facility], [address] [place], [place] [address]   Blood culture (routine x  2)     Status: None (Preliminary result)   Collection Time: 05/05/20  9:40 AM   Specimen: BLOOD RIGHT ARM  Result Value Ref Range Status   Specimen Description   Final    BLOOD RIGHT ARM Performed at [facility], [address] [place], [place] [address]    Special Requests   Final    BOTTLES DRAWN AEROBIC AND ANAEROBIC Blood Culture adequate volume Performed at [facility], [address], [place], [place] [address]    Culture   Final    NO GROWTH 1 DAY Performed at [facility], [address], [place], [place] [address]    Report Status PENDING  Incomplete     Time coordinating discharge:  SIGNED:   Erick Blinks, MD  [facility] 05/06/2020, 7:40 PM   If 7PM-7AM, please contact night-coverage [email or web address]

## 2020-05-06 NOTE — Progress Notes (Signed)
° ° °  CC: Left breast cellulitis  Subjective: Area is still very painful, she is crying with me taking off the tape for the dressing.  She does not have any cellulitis.    Objective: Vital signs in last 24 hours: Temp:  [98.3 F (36.8 C)-98.9 F (37.2 C)] 98.7 F (37.1 C) (03/17 0355) Pulse Rate:  [52-90] 87 (03/17 0355) Resp:  [16-18] 18 (03/17 0355) BP: (113-129)/(72-85) 124/84 (03/17 0355) SpO2:  [95 %-100 %] 100 % (03/17 0355)  1553 IV recorded, no other intake or output recorded. Afebrile vital signs are stable Creatinine slightly elevated 1.10, calcium 8.7, CBC is normal. Intake/Output from previous day: 03/16 0701 - 03/17 0700 In: 1553 [IV Piggyback:1553] Out: -  Intake/Output this shift: No intake/output data recorded.  General appearance: alert, cooperative, no distress and still quite uncomfortable Skin: see pictures below   With gauze tip inside the open I&D.  Gauze removed with no real cellulitis or drainage.    Lab Results:  Recent Labs    05/05/20 1640 05/06/20 0255  WBC 9.6 8.9  HGB 11.3* 10.8*  HCT 38.1 36.7  PLT 283 317    BMET Recent Labs    05/05/20 0935 05/05/20 1640 05/06/20 0255  NA 138  --  140  K 3.3*  --  3.9  CL 104  --  109  CO2 25  --  22  GLUCOSE 108*  --  103*  BUN <5*  --  <5*  CREATININE 0.73 0.80 1.10*  CALCIUM 8.9  --  8.7*   PT/INR No results for input(s): LABPROT, INR in the last 72 hours.  Recent Labs  Lab 05/03/20 1013 05/05/20 0935  AST 31 35  ALT 65* 69*  ALKPHOS 59 57  BILITOT 0.3 0.1*  PROT 8.0 8.2*  ALBUMIN 3.3* 3.3*     Lipase     Component Value Date/Time   LIPASE 30 03/11/2009 2136     Medications:  heparin  5,000 Units Subcutaneous Q8H    Assessment/Plan Hx of PE  Left breast abscess - S/p I&D in the ED on 3/14 - Agree with continuing abx  - No indication for surgical drainage at this time. Change dressing once daily: loosely pack with gauze, apply dry gauze on top and secure  with tape. Shower with wound open. - I think she can be discharged home on oral antibiotics once pain is controlled. - We will follow with you   FEN - reg diet VTE - SCDs, heparin subq ID - vanc 3/16 >>  Foley - None Follow-Up - [facility]  Plan:  She lives alone but family is nearby.  She needs to keep site clean and dry, I showed her how we just packed the tip of a open 2 x 2 into the site.  I told her to keep it clean and dry.  She was left with the site open so she can shower.  It is good to get soap and water into the site.    she has a follow up appointment in our office.  She can go when [facility] with Medicine.  Continue dressing change as noted above.  Follow up and dressing instructions are in the AVS.     LOS: 0 days    Norie Latendresse 05/06/2020 Please see Amion

## 2020-05-10 LAB — CULTURE, BLOOD (ROUTINE X 2)
Culture: NO GROWTH
Culture: NO GROWTH
Special Requests: ADEQUATE
Special Requests: ADEQUATE

## 2020-05-12 ENCOUNTER — Other Ambulatory Visit: Payer: Self-pay

## 2020-05-12 ENCOUNTER — Ambulatory Visit (INDEPENDENT_AMBULATORY_CARE_PROVIDER_SITE_OTHER): Payer: PRIVATE HEALTH INSURANCE | Admitting: Internal Medicine

## 2020-05-12 ENCOUNTER — Encounter: Payer: Self-pay | Admitting: Internal Medicine

## 2020-05-12 VITALS — Temp 98.5°F | Wt 287.0 lb

## 2020-05-12 DIAGNOSIS — L03313 Cellulitis of chest wall: Secondary | ICD-10-CM

## 2020-05-12 DIAGNOSIS — L0291 Cutaneous abscess, unspecified: Secondary | ICD-10-CM | POA: Diagnosis not present

## 2020-05-12 NOTE — Progress Notes (Signed)
[facility]  Reason for Consult:cellulitis Referring Provider: [facility] Kerry Hough, [place])    Patient Active Problem List   Diagnosis Date Noted  . Cellulitis 05/05/2020  . Left breast abscess 05/05/2020  . Uses birth control 05/05/2020  . Obesity, Class III, BMI 40-49.9 (morbid obesity) (HCC) 05/05/2020  . Microcytic anemia 05/05/2020  . Hypokalemia 05/05/2020  . Abdominal pain affecting pregnancy   . Encounter for fetal anatomic survey   . Amblyopia, both eyes 02/27/2013  . Refractive error 11/19/2012  . 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 female female recently seen in ED and treated with dalbavancin for cellulitis, referred here for post visit care  I reviewed labs/records from epic  She was seen in ed on 3/14 for cellulitis/left sided breast abscess. She had a chest cta which showed no PE, but confirmed the left breast area abscess. This was lanced/drained in the ED. She returns with nausea and worsening cellulitic change on 3/16 and was kept overnight. There was no evidence of sepsis. ID evaluated and gave her a dose of dalbavancin. She discharged with wound packing in place, on 3/17  She returned today 3/23. The wound packing is no longer there The pain is much better almost revoled No further swelling/redness No f/c No n/v/diarrhea/rash  She appears to have general surgery f/u as well  She has no other concern  This is her first episode of ssi hiv screen was negative 3/16 bcx was negative No wound cx sent  Review of Systems: ROS Other ros negative      Past Medical History:  Diagnosis Date  . Medical history non-contributory   . PE (pulmonary thromboembolism) (HCC)     Social History   Tobacco Use  . Smoking status: Never Smoker  . Smokeless tobacco: Never Used  Substance Use Topics  . Alcohol use: No  . Drug use: No    No family history on file.  Allergies   Allergen Reactions  . Amoxicillin Swelling    OBJECTIVE: Vitals:   05/12/20 0854  Temp: 98.5 F (36.9 C)  TempSrc: Oral  Weight: 287 lb (130.2 kg)   Body mass index is 52.49 kg/m.   Physical Exam No distress, conversant, obese Heent: per; eomi; conj clear; normocephalic Neck supple cv rrr no mrg Lungs clear abd s/nt Ext no edema Skin no rash; the opening on the left chest wall below the breast line almost closed; there is mild induration <1inch in that area and mild-mod tenderness without fluctuance/surrounding erythema/discharge on expression of it Neuro cn2-12 intact, nonfocal Psych alert/oriented   Lab: 3/16 cr 0.7; lft 35/69/57/0.1; cbc 10.8/12/338 3/14 wbc 14  Microbiology: 3/16 bcx negative  Serology: hiv negative  Imaging: 3/14 chest cta Personally reviewed and incorparated findings into treatment decision 1. No pulmonary embolus identified, middle and lower lobe subsegmental and distal branches are obscured. 2. Superficial soft tissue swelling and stranding in a 5-6 cm area along the crease of the left lower breast compatible with cellulitis, phlegmon. No drainable fluid collection or soft tissue gas. 3. Otherwise negative CT appearance of the chest.  Assessment/plan: Problem List Items Addressed This Visit      Other   Cellulitis    Other Visit Diagnoses    Abscess    -  Primary      First episode, appears treated Discussed with her possible relapse espcecially if abscess hasn't been all lanced  -  no further abx indicated at this time -monitor for sign of relapse ssi and rtc as needed     Follow-up: Return if symptoms worsen or fail to improve.  Raymondo Band, MD [facility] [phone] pager   [phone] cell 05/12/2020, 9:04 AM

## 2020-05-12 NOTE — Patient Instructions (Addendum)
You are doing well. The infection seems treated  There is a possibility, given this was an abscess, that it might not have fully drained. If increased swelling/pain/redness in the area, please call our clinic  Warm compress with epsom's salt soak twice a day as needed  No need to schedule follow up at this time

## 2020-06-16 ENCOUNTER — Other Ambulatory Visit: Payer: Self-pay | Admitting: Student

## 2020-06-16 DIAGNOSIS — L0291 Cutaneous abscess, unspecified: Secondary | ICD-10-CM

## 2020-06-17 ENCOUNTER — Other Ambulatory Visit: Payer: Self-pay

## 2020-06-17 ENCOUNTER — Ambulatory Visit
Admission: RE | Admit: 2020-06-17 | Discharge: 2020-06-17 | Disposition: A | Discharge status: Home / Self Care | Payer: PRIVATE HEALTH INSURANCE | Source: Ambulatory Visit | Attending: Student | Admitting: Student

## 2020-06-17 ENCOUNTER — Other Ambulatory Visit: Payer: Self-pay | Admitting: Student

## 2020-06-17 DIAGNOSIS — L0291 Cutaneous abscess, unspecified: Secondary | ICD-10-CM

## 2020-06-23 LAB — AEROBIC/ANAEROBIC CULTURE W GRAM STAIN (SURGICAL/DEEP WOUND)

## 2020-06-25 ENCOUNTER — Inpatient Hospital Stay
Admission: RE | Admit: 2020-06-25 | Discharge: 2020-06-25 | Disposition: A | Discharge status: Home / Self Care | Payer: PRIVATE HEALTH INSURANCE | Source: Ambulatory Visit | Attending: Student | Admitting: Student

## 2020-06-29 NOTE — Progress Notes (Signed)
Unable to reach patient 5/9, 5/10-voicemail left to call for results, assessment of breast,  and to reschedule appt- will notify Puja Gosai by Epic inbasket-

## 2020-08-16 ENCOUNTER — Emergency Department (HOSPITAL_BASED_OUTPATIENT_CLINIC_OR_DEPARTMENT_OTHER)
Admission: EM | Admit: 2020-08-16 | Discharge: 2020-08-16 | Disposition: A | Discharge status: Home / Self Care | Payer: No Typology Code available for payment source | Attending: Emergency Medicine | Admitting: Emergency Medicine

## 2020-08-16 ENCOUNTER — Encounter (HOSPITAL_BASED_OUTPATIENT_CLINIC_OR_DEPARTMENT_OTHER): Payer: Self-pay | Admitting: Urology

## 2020-08-16 ENCOUNTER — Other Ambulatory Visit: Payer: Self-pay

## 2020-08-16 DIAGNOSIS — N390 Urinary tract infection, site not specified: Secondary | ICD-10-CM

## 2020-08-16 DIAGNOSIS — R300 Dysuria: Secondary | ICD-10-CM | POA: Diagnosis present

## 2020-08-16 DIAGNOSIS — R110 Nausea: Secondary | ICD-10-CM | POA: Insufficient documentation

## 2020-08-16 DIAGNOSIS — R000 Tachycardia, unspecified: Secondary | ICD-10-CM | POA: Diagnosis not present

## 2020-08-16 LAB — URINALYSIS, ROUTINE W REFLEX MICROSCOPIC
Bilirubin Urine: NEGATIVE
Glucose, UA: NEGATIVE mg/dL
Ketones, ur: NEGATIVE mg/dL
Nitrite: POSITIVE — AB
Protein, ur: 30 mg/dL — AB
Specific Gravity, Urine: 1.025 (ref 1.005–1.030)
pH: 6.5 (ref 5.0–8.0)

## 2020-08-16 LAB — URINALYSIS, MICROSCOPIC (REFLEX)

## 2020-08-16 LAB — PREGNANCY, URINE: Preg Test, Ur: NEGATIVE

## 2020-08-16 MED ORDER — SULFAMETHOXAZOLE-TRIMETHOPRIM 800-160 MG PO TABS: 1.0000 | ORAL_TABLET | Freq: Two times a day (BID) | ORAL | 0 refills | Status: AC

## 2020-08-16 NOTE — ED Provider Notes (Signed)
[facility] EMERGENCY DEPARTMENT Provider Note   CSN: [id number] Arrival date & time: 08/16/20  1725     History Chief Complaint  Patient presents with   Dysuria    Vicki Mays is a 30 y.o. female.  The history is provided by the patient. No language interpreter was used.  Dysuria  30 year old female presenting for complaints of urinary discomfort.  Patient endorsed for the past 5 days she has had urinary discomfort, pain to her lower back and her abdomen, increased urinary frequency.  She denies having fever but does endorse nausea without vomiting or diarrhea.  No report of vaginal bleeding or vaginal discharge.  No specific treatment tried.  Symptoms moderate in severity, nonradiating.  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 No pertinent family history.  Social History   Tobacco Use   Smoking status: Never   Smokeless tobacco: Never  Substance Use Topics   Alcohol use: No   Drug use: No    Home Medications Prior to Admission medications   Medication Sig Start Date End Date Taking? Authorizing Provider  doxycycline (VIBRAMYCIN) 100 MG capsule TAKE 1 CAPSULE BY  MOUTH TWICE DAILY 05/03/20 05/03/21  Alvira Monday, MD  HYDROcodone-acetaminophen (NORCO/VICODIN) 5-325 MG tablet TAKE 1 TABLET BY MOUTH EVERY 4 HOURS AS NEEDED 05/03/20 10/30/20  Alvira Monday, MD  LOW-OGESTREL 0.3-30 MG-MCG tablet Take by mouth. 04/14/20   [provider]  ondansetron (ZOFRAN ODT) 4 MG disintegrating tablet Take 1 tablet (4 mg total) by mouth every 8 (eight) hours as needed for nausea or vomiting. 05/06/20   Erick Blinks, MD  oxyCODONE 10 MG TABS Take 1 tablet (10 mg total) by mouth every 4 (four) hours as needed for moderate pain or severe pain. Patient not taking: Reported on 05/12/2020 05/06/20   Erick Blinks, MD    Allergies    Amoxicillin  Review of Systems   Review of Systems  Genitourinary:  Positive for dysuria.  All other systems reviewed and are negative.  Physical Exam Updated Vital Signs BP (!) 138/106 (BP Location: Left Arm)   Pulse (!) 115   Temp 98.8 F (37.1 C) (Oral)   Resp 18   Ht 5\' 3"  (1.6 m)   Wt 129.3 kg   SpO2 100%   BMI 50.49 kg/m   Physical Exam Vitals and nursing note reviewed.  Constitutional:      General: She is not  in acute distress.    Appearance: She is well-developed. She is obese.  HENT:     Head: Atraumatic.  Eyes:     Conjunctiva/sclera: Conjunctivae normal.  Cardiovascular:     Rate and Rhythm: Tachycardia present.  Pulmonary:     Effort: Pulmonary effort is normal.  Abdominal:     Palpations: Abdomen is soft.     Tenderness: There is no abdominal tenderness. There is no right CVA tenderness or left CVA tenderness.  Musculoskeletal:     Cervical back: Neck supple.  Skin:    Findings: No rash.  Neurological:     Mental Status: She is alert.  Psychiatric:        Mood and Affect: Mood normal.    ED Results / Procedures / Treatments   Labs (all labs ordered are listed, but only abnormal results are displayed) Labs Reviewed  URINALYSIS, ROUTINE W REFLEX MICROSCOPIC - Abnormal; Notable for the  following components:      Result Value   APPearance CLOUDY (*)    Hgb urine dipstick MODERATE (*)    Protein, ur 30 (*)    Nitrite POSITIVE (*)    Leukocytes,Ua MODERATE (*)    All other components within normal limits  URINALYSIS, MICROSCOPIC (REFLEX) - Abnormal; Notable for the following components:   Bacteria, UA MANY (*)    All other components within normal limits  PREGNANCY, URINE    EKG None  Radiology No results found.  Procedures Procedures   Medications Ordered in ED Medications - No data to display  ED Course  I have reviewed the triage vital signs and the nursing notes.  Pertinent labs & imaging results that were available during my care of the patient were reviewed by me and considered in my medical decision making (see chart for details).    MDM Rules/Calculators/A&P                          BP (!) 138/106 (BP Location: Left Arm)   Pulse (!) 115   Temp 98.8 F (37.1 C) (Oral)   Resp 18   Ht 5\' 3"  (1.6 m)   Wt 129.3 kg   SpO2 100%   BMI 50.49 kg/m   Final Clinical Impression(s) / ED Diagnoses Final diagnoses:  Lower urinary tract infectious disease    Rx / DC Orders ED Discharge Orders     None      6:23 PM Patient complained of dysuria for the past for 5 days.  She does not have any CVA tenderness to suggest pyelonephritis but UA is consistent with urine tract infection.  Abdomen very soft nontender.  Will treat with antibiotic but she is stable for discharge.  Return precaution given.   , PA-C 08/16/20 1827    08/18/20, DO 08/16/20 2147

## 2020-08-16 NOTE — ED Triage Notes (Signed)
Pt states bladder pain, worse with urination since Thursday. Pt reports some lower back pain

## 2020-08-24 ENCOUNTER — Other Ambulatory Visit: Payer: Self-pay

## 2020-08-24 ENCOUNTER — Emergency Department (HOSPITAL_BASED_OUTPATIENT_CLINIC_OR_DEPARTMENT_OTHER)
Admission: EM | Admit: 2020-08-24 | Discharge: 2020-08-24 | Disposition: A | Discharge status: Home / Self Care | Payer: PRIVATE HEALTH INSURANCE | Attending: Emergency Medicine | Admitting: Emergency Medicine

## 2020-08-24 ENCOUNTER — Other Ambulatory Visit (HOSPITAL_BASED_OUTPATIENT_CLINIC_OR_DEPARTMENT_OTHER): Payer: Self-pay

## 2020-08-24 ENCOUNTER — Encounter (HOSPITAL_BASED_OUTPATIENT_CLINIC_OR_DEPARTMENT_OTHER): Payer: Self-pay | Admitting: Emergency Medicine

## 2020-08-24 ENCOUNTER — Emergency Department (HOSPITAL_BASED_OUTPATIENT_CLINIC_OR_DEPARTMENT_OTHER): Payer: PRIVATE HEALTH INSURANCE

## 2020-08-24 DIAGNOSIS — N12 Tubulo-interstitial nephritis, not specified as acute or chronic: Secondary | ICD-10-CM | POA: Diagnosis not present

## 2020-08-24 DIAGNOSIS — R1031 Right lower quadrant pain: Secondary | ICD-10-CM | POA: Diagnosis present

## 2020-08-24 LAB — URINALYSIS, ROUTINE W REFLEX MICROSCOPIC
Bilirubin Urine: NEGATIVE
Glucose, UA: NEGATIVE mg/dL
Ketones, ur: NEGATIVE mg/dL
Nitrite: NEGATIVE
Protein, ur: NEGATIVE mg/dL
Specific Gravity, Urine: >1.03 — ABNORMAL HIGH (ref 1.005–1.030)
pH: 6 (ref 5.0–8.0)

## 2020-08-24 LAB — CBC WITH DIFFERENTIAL/PLATELET
Abs Immature Granulocytes: 0.03 10*3/uL (ref 0.00–0.07)
Basophils Absolute: 0.1 10*3/uL (ref 0.0–0.1)
Basophils Relative: 1 %
Eosinophils Absolute: 0.2 10*3/uL (ref 0.0–0.5)
Eosinophils Relative: 2 %
HCT: 43 % (ref 36.0–46.0)
Hemoglobin: 12.7 g/dL (ref 12.0–15.0)
Immature Granulocytes: 0 %
Lymphocytes Relative: 31 %
Lymphs Abs: 3.2 10*3/uL (ref 0.7–4.0)
MCH: 22 pg — ABNORMAL LOW (ref 26.0–34.0)
MCHC: 29.5 g/dL — ABNORMAL LOW (ref 30.0–36.0)
MCV: 74.5 fL — ABNORMAL LOW (ref 80.0–100.0)
Monocytes Absolute: 0.9 10*3/uL (ref 0.1–1.0)
Monocytes Relative: 9 %
Neutro Abs: 5.9 10*3/uL (ref 1.7–7.7)
Neutrophils Relative %: 57 %
Platelets: 281 10*3/uL (ref 150–400)
RBC: 5.77 MIL/uL — ABNORMAL HIGH (ref 3.87–5.11)
RDW: 18 % — ABNORMAL HIGH (ref 11.5–15.5)
WBC: 10.3 10*3/uL (ref 4.0–10.5)
nRBC: 0 % (ref 0.0–0.2)

## 2020-08-24 LAB — URINALYSIS, MICROSCOPIC (REFLEX): WBC, UA: >50 WBC/hpf (ref 0–5)

## 2020-08-24 LAB — PREGNANCY, URINE: Preg Test, Ur: NEGATIVE

## 2020-08-24 MED ORDER — ONDANSETRON HCL 4 MG/2ML IJ SOLN
4.0000 mg | Freq: Once | INTRAMUSCULAR | Status: AC
  Administered 2020-08-24: 4 mg via INTRAVENOUS
  Filled 2020-08-24: qty 2

## 2020-08-24 MED ORDER — ONDANSETRON 4 MG PO TBDP
4.0000 mg | ORAL_TABLET | Freq: Three times a day (TID) | ORAL | 0 refills | Status: DC | PRN
  Filled 2020-08-24: qty 20, 7d supply, fill #0

## 2020-08-24 MED ORDER — CEPHALEXIN 500 MG PO CAPS
500.0000 mg | ORAL_CAPSULE | Freq: Three times a day (TID) | ORAL | 0 refills | Status: AC
  Filled 2020-08-24: qty 42, 14d supply, fill #0

## 2020-08-24 MED ORDER — KETOROLAC TROMETHAMINE 30 MG/ML IJ SOLN
30.0000 mg | Freq: Once | INTRAMUSCULAR | Status: AC
  Administered 2020-08-24: 30 mg via INTRAVENOUS
  Filled 2020-08-24: qty 1

## 2020-08-24 MED ORDER — IOHEXOL 300 MG/ML  SOLN
100.0000 mL | Freq: Once | INTRAMUSCULAR | Status: AC | PRN
  Administered 2020-08-24: 100 mL via INTRAVENOUS

## 2020-08-24 MED ORDER — SODIUM CHLORIDE 0.9 % IV BOLUS
1000.0000 mL | Freq: Once | INTRAVENOUS | Status: AC
  Administered 2020-08-24: 1000 mL via INTRAVENOUS

## 2020-08-24 NOTE — ED Notes (Signed)
Pt verbally consents to [facility]

## 2020-08-24 NOTE — ED Notes (Signed)
Triage delayed, pt in bathroom providing urine specimen

## 2020-08-24 NOTE — ED Notes (Signed)
Pt reports inability to drink contrast, causes N/V. Will update EDP

## 2020-08-24 NOTE — ED Triage Notes (Signed)
Pt arrives pov with c/o RLQ pain radiating to R flank x 6 days. Pt endorses N/V, denies diarrhea. Pt endorses urinary freq, currently on abx, reports meds not working

## 2020-08-24 NOTE — ED Notes (Signed)
Patient transported to CT 

## 2020-08-24 NOTE — ED Notes (Signed)
Pt discharged to home. Discharge instructions have been discussed with patient and/or family members. Pt verbally acknowledges understanding d/c instructions, and endorses comprehension to checkout at registration before leaving.  °

## 2020-08-24 NOTE — ED Provider Notes (Signed)
[facility] EMERGENCY DEPARTMENT Provider Note   CSN: [id number] Arrival date & time: 08/24/20  3419     History Chief Complaint  Patient presents with   Flank Pain    Vicki Mays is a 30 y.o. female.  HPI     29yo female with history of PE, left breast abscess, recent UTI diagnosis 6/27 who presents with concern for right flank and right lower abdominal pain.  When urinate still having significant urinary symptoms Vomiting but not sure if it is because of abx ( unknown abx BID for 7 days) urinating frequently, every 10 minutes, urine smells sweet Constant right lower back where it is worse 10/10, right lower abdomen with pain as well.  No radiation down legs. NO numbness/weakness loss of control of bowel or bladder. No hx of kidney stones. No vaginal discharge. 2 fevers most recently 2 days ago was 101.3 toot tylenol and it improved  Past Medical History:  Diagnosis Date   Medical history non-contributory    PE (pulmonary thromboembolism) (HCC)     Patient Active Problem List   Diagnosis Date Noted   Cellulitis 05/05/2020   Left breast abscess 05/05/2020   Uses birth control 05/05/2020   Obesity, Class III, BMI 40-49.9 (morbid obesity) (HCC) 05/05/2020   Microcytic anemia 05/05/2020   Hypokalemia 05/05/2020   Abdominal pain affecting pregnancy    Encounter for fetal anatomic survey    Amblyopia, both eyes 02/27/2013   Refractive error 11/19/2012   Exotropia, 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 No pertinent family history.  Social History   Tobacco Use   Smoking status: Never   Smokeless tobacco: Never  Substance Use Topics   Alcohol use: No   Drug use: No    Home Medications Prior  to Admission medications   Medication Sig Start Date End Date Taking? Authorizing Provider  cephALEXin (KEFLEX) 500 MG capsule Take 1 capsule (500 mg total) by mouth 3 (three) times daily for 14 days. 08/24/20 09/07/20 Yes Alvira Monday, MD  ondansetron (ZOFRAN ODT) 4 MG disintegrating tablet Take 1 tablet (4 mg total) by mouth every 8 (eight) hours as needed for nausea or vomiting. 08/24/20  Yes Alvira Monday, MD  doxycycline (VIBRAMYCIN) 100 MG capsule TAKE 1 CAPSULE BY MOUTH TWICE DAILY 05/03/20 05/03/21  Alvira Monday, MD  HYDROcodone-acetaminophen (NORCO/VICODIN) 5-325 MG tablet TAKE 1 TABLET BY MOUTH EVERY 4 HOURS AS NEEDED 05/03/20 10/30/20  Alvira Monday, MD  LOW-OGESTREL 0.3-30 MG-MCG tablet Take by mouth. 04/14/20   [provider]  oxyCODONE 10 MG TABS Take 1 tablet (10 mg total) by mouth every 4 (four) hours as needed for moderate pain or severe pain. Patient not taking: Reported on 05/12/2020 05/06/20   Erick Blinks, MD    Allergies    Amoxicillin  Review of Systems   Review of Systems  Constitutional:  Positive for fever.  HENT:  Negative for sore throat.   Eyes:  Negative for visual disturbance.  Respiratory:  Negative for cough and shortness of breath.   Cardiovascular:  Negative for chest pain.  Gastrointestinal:  Positive for abdominal pain, nausea and vomiting. Negative for constipation and diarrhea.  Genitourinary:  Positive for dysuria, frequency and hematuria. Negative for difficulty urinating.  Musculoskeletal:  Negative for back pain and neck pain.  Skin:  Negative for rash.  Neurological:  Negative for syncope and headaches.   Physical Exam Updated Vital Signs BP 102/77   Pulse 88   Temp 98.7 F (37.1 C) (Oral)   Resp 18   Ht 5\' 2"  (1.575 m)   Wt 127 kg   LMP 08/15/2020   SpO2 100%   BMI 51.21 kg/m   Physical Exam Vitals and nursing note reviewed.  Constitutional:      General: She is not in acute distress.    Appearance: Normal  appearance. She is not ill-appearing, toxic-appearing or diaphoretic.  HENT:     Head: Normocephalic.  Eyes:     Conjunctiva/sclera: Conjunctivae normal.  Cardiovascular:     Rate and Rhythm: Normal rate and regular rhythm.     Pulses: Normal pulses.  Pulmonary:     Effort: Pulmonary effort is normal. No respiratory distress.  Abdominal:     Tenderness: There is abdominal tenderness. There is right CVA tenderness.  Musculoskeletal:        General: No deformity or signs of injury.     Cervical back: No rigidity.  Skin:    General: Skin is warm and dry.     Coloration: Skin is not jaundiced or pale.  Neurological:     General: No focal deficit present.     Mental Status: She is alert and oriented to person, place, and time.    ED Results / Procedures / Treatments   Labs (all labs ordered are listed, but only abnormal results are displayed) Labs Reviewed  CBC WITH DIFFERENTIAL/PLATELET - Abnormal; Notable for the following components:      Result Value   RBC 5.77 (*)    MCV 74.5 (*)    MCH 22.0 (*)    MCHC 29.5 (*)    RDW 18.0 (*)    All other components within normal limits  URINALYSIS, ROUTINE W REFLEX MICROSCOPIC - Abnormal; Notable for the following components:   Specific Gravity, Urine >1.030 (*)    Hgb urine dipstick SMALL (*)    Leukocytes,Ua MODERATE (*)    All other components within normal limits  URINALYSIS, MICROSCOPIC (REFLEX) - Abnormal; Notable for the following components:   Bacteria, UA FEW (*)    All other components within normal limits  URINE CULTURE  PREGNANCY, URINE    EKG None  Radiology CT ABDOMEN PELVIS W CONTRAST  Result Date: 08/24/2020 CLINICAL DATA:  RIGHT lower quadrant pain. Concern for appendicitis. EXAM: CT ABDOMEN AND PELVIS WITH CONTRAST TECHNIQUE: Multidetector CT imaging of the abdomen and pelvis was performed using the standard protocol following bolus administration of intravenous contrast. CONTRAST:  10/25/2020 OMNIPAQUE IOHEXOL 300  MG/ML  SOLN COMPARISON:  Remote CT abdomen 03/11/2009 FINDINGS: Lower chest: Lung bases are clear. Hepatobiliary: 16 mm subcapsular hypoenhancing lesion in the lateral segment of the LEFT hepatic lobe (image 10/series 2). Lesion not clearly defined on remote CT. Small ill-defined lesion in the LEFT lateral hepatic lobe on image 19/2. Gallbladder normal. Pancreas: Pancreas is normal. No ductal dilatation. No pancreatic inflammation. Spleen: Normal spleen Adrenals/urinary tract: Adrenal glands and kidneys are normal. The ureters and bladder normal. Stomach/Bowel: Stomach, small bowel, appendix,  and cecum are normal. The colon and rectosigmoid colon are normal. Vascular/Lymphatic: Abdominal aorta is normal caliber. No periportal or retroperitoneal adenopathy. No pelvic adenopathy. Reproductive: Uterus and adnexa unremarkable. Other: No free fluid. Musculoskeletal: No aggressive osseous lesion. IMPRESSION: 1. Normal appendix. 2. No explanation for abdominal pain. 3. Normal gallbladder. 4. Normal ovaries and uterus. 5. No obstructive uropathy. 6. Hypoenhancing lesion in the lateral aspect of the LEFT hepatic lobe is incompletely characterized. Lesion not present on remote CT from 2011. Recommend MRI abdomen with and without contrast for characterization of this probable benign lesion. Electronically Signed   By: Genevive Bi M.D.   On: 08/24/2020 09:43    Procedures Procedures   Medications Ordered in ED Medications  ketorolac (TORADOL) 30 MG/ML injection 30 mg (30 mg Intravenous Given 08/24/20 0832)  sodium chloride 0.9 % bolus 1,000 mL ( Intravenous Stopped 08/24/20 0948)  ondansetron (ZOFRAN) injection 4 mg (4 mg Intravenous Given 08/24/20 0832)  iohexol (OMNIPAQUE) 300 MG/ML solution 100 mL (100 mLs Intravenous Contrast Given 08/24/20 0915)    ED Course  I have reviewed the triage vital signs and the nursing notes.  Pertinent labs & imaging results that were available during my care of the patient were  reviewed by me and considered in my medical decision making (see chart for details).    MDM Rules/Calculators/A&P                           29yo female with history of PE, left breast abscess, recent UTI diagnosis 6/27 who presents with concern for right flank and right lower abdominal pain. DDx includes appendicitis, cholecystitis, pyelonephritis, nephrolithiasis, diverticulitis, PID, ovarian torsion, ectopic pregnancy,tuboovarian abscess,perinephric abscess, epidural abscess.  CT shows no acute abnormalities. Does show an incompletely characterized probable benign hepatic lesion for which radiology recommends nonemergent MRI abdomen W and WO contrast for further characterization.   UA concerning for continued UTI and given urinary symptoms in addition to abdominal and flank pain suspect pyelonephritis.  We do not have culture data from most recent diagnosis and I am unable to see in the records which antibiotic she was placed on.  Will send cultures, place her on TID keflex, zofran for nausea, and discussed return precautions.     Final Clinical Impression(s) / ED Diagnoses Final diagnoses:  Pyelonephritis    Rx / DC Orders ED Discharge Orders          Ordered    cephALEXin (KEFLEX) 500 MG capsule  3 times daily        08/24/20 0959    ondansetron (ZOFRAN ODT) 4 MG disintegrating tablet  Every 8 hours PRN        08/24/20 0959             Alvira Monday, MD 08/25/20 [phone]

## 2020-08-26 LAB — URINE CULTURE: Culture: 80000 — AB

## 2020-08-27 ENCOUNTER — Telehealth: Payer: Self-pay | Admitting: Emergency Medicine

## 2020-08-27 NOTE — Telephone Encounter (Signed)
Post ED Visit - Positive Culture Follow-up  Culture report reviewed by antimicrobial stewardship pharmacist: [facility] []  , Pharm.D. []  Enzo Bi, .D., BCPS AQ-ID []  Celedonio Miyamoto, Pharm.D., BCPS []  [address], .D., BCPS []  [place], .D., BCPS, AAHIVP []  Georgina Pillion, Pharm.D., BCPS, AAHIVP []  [address], PharmD, BCPS []  , PharmD, BCPS []  [place], PharmD, BCPS [x]  [address], PharmD []  , PharmD, BCPS []  Estella Husk, PharmD  Pharmacy Team []  Lysle Pearl, PharmD []  , PharmD []  Phillips Climes, PharmD []  , Rph []  Agapito Games) , PharmD []  Loleta Dicker, PharmD []  , PharmD []  Mervyn Gay, PharmD []  , PharmD []  Vinnie Level, PharmD []  [facility], PharmD []  , PharmD []  Len Childs, PharmD   Positive urine culture Treated with Cephalexin, organism sensitive to the same and no further patient follow-up is required at this time.  Holle Sprick 08/27/2020, 10:22 AM

## 2020-10-15 ENCOUNTER — Emergency Department (HOSPITAL_BASED_OUTPATIENT_CLINIC_OR_DEPARTMENT_OTHER)
Admission: EM | Admit: 2020-10-15 | Discharge: 2020-10-15 | Disposition: A | Discharge status: Home / Self Care | Payer: No Typology Code available for payment source | Attending: Emergency Medicine | Admitting: Emergency Medicine

## 2020-10-15 ENCOUNTER — Encounter (HOSPITAL_BASED_OUTPATIENT_CLINIC_OR_DEPARTMENT_OTHER): Payer: Self-pay

## 2020-10-15 ENCOUNTER — Other Ambulatory Visit: Payer: Self-pay

## 2020-10-15 DIAGNOSIS — N611 Abscess of the breast and nipple: Secondary | ICD-10-CM | POA: Insufficient documentation

## 2020-10-15 DIAGNOSIS — N644 Mastodynia: Secondary | ICD-10-CM

## 2020-10-15 MED ORDER — KETOROLAC TROMETHAMINE 30 MG/ML IJ SOLN
30.0000 mg | Freq: Once | INTRAMUSCULAR | Status: AC
  Administered 2020-10-15: 30 mg via INTRAMUSCULAR
  Filled 2020-10-15: qty 1

## 2020-10-15 NOTE — ED Triage Notes (Signed)
Pt c/o abscess under left breast x 2 weeks-NAD-steady gait

## 2020-10-15 NOTE — ED Notes (Signed)
Pt no found in room when PA went see.

## 2020-10-15 NOTE — ED Provider Notes (Signed)
[facility] EMERGENCY DEPARTMENT Provider Note   CSN: [id number] Arrival date & time: 10/15/20  1604     History Chief Complaint  Patient presents with   Abscess    Vicki Mays is a 30 y.o. female with history of recurrent left breast abscess who presents with concern for 2 week of left lower breast/chest wall pain, chills.  States that she was treated in the ER with incision and drainage of left breast abscess back in March of this year and was subsequently admitted to the hospital for cellulitis associated with the abscess site.  At that time she received dalbavancin and was recommended follow-up with general surgery infectious disease.  I personally read this patient's medical records.She has history of exotropia, obesity, microcytic anemia as well as PE in the past.  She is not on any medications every day.   HPI     Past Medical History:  Diagnosis Date   Medical history non-contributory    PE (pulmonary thromboembolism) (HCC)     Patient Active Problem List   Diagnosis Date Noted   Cellulitis 05/05/2020   Left breast abscess 05/05/2020   Uses birth control 05/05/2020   Obesity, Class III, BMI 40-49.9 (morbid obesity) (HCC) 05/05/2020   Microcytic anemia 05/05/2020   Hypokalemia 05/05/2020   Abdominal pain affecting pregnancy    Encounter for fetal anatomic survey    Amblyopia, both eyes 02/27/2013   Refractive error 11/19/2012   Exotropia, alternating 11/19/2012    Past Surgical History:  Procedure Laterality Date   EYE SURGERY       OB History     Gravida  1   Para  0   Term  0   Preterm  0   AB  0   Living  0      SAB  0   IAB  0   Ectopic  0   Multiple  0   Live Births              No family history on file.  Social History   Tobacco Use   Smoking status: Never   Smokeless tobacco: Never  Vaping Use   Vaping Use: Never used  Substance Use Topics   Alcohol use: No   Drug use: No    Home  Medications Prior to Admission medications   Not on File    Allergies    Amoxicillin  Review of Systems   Review of Systems  Constitutional:  Positive for activity change, appetite change and chills. Negative for fatigue and fever.  HENT: Negative.    Respiratory: Negative.    Cardiovascular: Negative.   Gastrointestinal: Negative.   Genitourinary: Negative.   Musculoskeletal: Negative.   Skin:  Positive for wound.       Swelling redness and pain under the left breast   Neurological: Negative.   Hematological: Negative.    Physical Exam Updated Vital Signs BP (!) 145/108 (BP Location: Right Arm)   Pulse 92   Temp 98.5 F (36.9 C) (Oral)   Resp 16   Ht 5\' 2"  (1.575 m)   Wt 133.8 kg   LMP 10/11/2020   SpO2 99%   BMI 53.96 kg/m   Physical Exam Vitals and nursing note reviewed. Exam conducted with a chaperone present.  Constitutional:      Appearance: She is not ill-appearing or toxic-appearing.  HENT:     Head: Normocephalic and atraumatic.     Nose: Nose normal. No congestion.  Mouth/Throat:     Mouth: Mucous membranes are moist.     Pharynx: Oropharynx is clear. Uvula midline. No oropharyngeal exudate or posterior oropharyngeal erythema.     Tonsils: No tonsillar exudate.  Eyes:     General: Lids are normal. Vision grossly intact.        Right eye: No discharge.        Left eye: No discharge.     Extraocular Movements: Extraocular movements intact.     Conjunctiva/sclera: Conjunctivae normal.     Pupils: Pupils are equal, round, and reactive to light.     Comments: Exotropia of the left eye  Cardiovascular:     Rate and Rhythm: Normal rate and regular rhythm.     Pulses: Normal pulses.     Heart sounds: Normal heart sounds. No murmur heard. Pulmonary:     Effort: Pulmonary effort is normal. No respiratory distress.     Breath sounds: Normal breath sounds. No wheezing or rales.  Chest:     Chest wall: Swelling and tenderness present. No mass,  lacerations, deformity or crepitus.       Comments: No tenderness of the breast itself, no nipple discharge. Abdominal:     General: Bowel sounds are normal. There is no distension.     Palpations: Abdomen is soft.     Tenderness: There is no abdominal tenderness. There is no guarding or rebound.  Musculoskeletal:        General: No deformity.     Cervical back: Normal range of motion and neck supple. No edema, rigidity or crepitus. No pain with movement, spinous process tenderness or muscular tenderness.  Lymphadenopathy:     Upper Body:     Right upper body: No supraclavicular or axillary adenopathy.     Left upper body: No supraclavicular or axillary adenopathy.  Skin:    General: Skin is warm and dry.     Capillary Refill: Capillary refill takes less than 2 seconds.  Neurological:     General: No focal deficit present.     Mental Status: She is alert and oriented to person, place, and time. Mental status is at baseline.  Psychiatric:        Mood and Affect: Mood normal.    ED Results / Procedures / Treatments   Labs (all labs ordered are listed, but only abnormal results are displayed) Labs Reviewed - No data to display  EKG None  Radiology No results found.  Procedures Procedures   Medications Ordered in ED Medications  ketorolac (TORADOL) 30 MG/ML injection 30 mg (30 mg Intramuscular Given 10/15/20 1813)    ED Course  I have reviewed the triage vital signs and the nursing notes.  Pertinent labs & imaging results that were available during my care of the patient were reviewed by me and considered in my medical decision making (see chart for details).    MDM Rules/Calculators/A&P                         30 year old female with history of recurrent left breast abscess who presents to the ED for evaluation of 2 weeks of left breast pain, chills, and fatigue.  Differential diagnosis includes limited to cellulitis, breast abscess, mastitis, folliculitis, sepsis,  necrotizing soft tissue infection.  Hypertensive on intake, vital signs otherwise normal.  Cardiopulmonary exam is normal abdominal exam is benign.  Breast exam performed in presence of chaperone revealed normal-appearing left breast with the exception of 3 x 4  cm area of induration, very mild erythema, and tenderness to palpation of the inferior peripheral breast and adjacent chest wall without area of fluctuance to suggest abscess amenable to drainage.  Patient was administered dose of Toradol in the ED, with plan to perform bedside ultrasonography for evaluation of underlying abscess despite reassuring physical exam most consistent with cellulitis.  Unfortunately, bedside ultrasonography was delayed due to necessity of other patients for this provider.  When I returned to the room to perform bedside US and possible I&D, patient had eloped from the emergency department.  She may return to the emergency department at any time for further evaluation.  Do feel she will require antibiotic therapy.  Recommend she follow-up in the outpatient OB/GYN setting.  This chart was dictated using voice recognition software, Dragon. Despite the best efforts of this provider to proofread and correct errors, errors may still occur which can change documentation meaning.   Final Clinical Impression(s) / ED Diagnoses Final diagnoses:  None    Rx / DC Orders ED Discharge Orders     None        Sherrilee Gilles 10/15/20 1932    Alvira Monday, MD 10/18/20 1039

## 2021-02-22 ENCOUNTER — Encounter (HOSPITAL_BASED_OUTPATIENT_CLINIC_OR_DEPARTMENT_OTHER): Payer: Self-pay

## 2021-02-22 ENCOUNTER — Emergency Department (HOSPITAL_BASED_OUTPATIENT_CLINIC_OR_DEPARTMENT_OTHER)
Admission: EM | Admit: 2021-02-22 | Discharge: 2021-02-22 | Disposition: A | Discharge status: Home / Self Care | Payer: PRIVATE HEALTH INSURANCE | Attending: Emergency Medicine | Admitting: Emergency Medicine

## 2021-02-22 ENCOUNTER — Other Ambulatory Visit: Payer: Self-pay

## 2021-02-22 DIAGNOSIS — J020 Streptococcal pharyngitis: Secondary | ICD-10-CM | POA: Diagnosis not present

## 2021-02-22 DIAGNOSIS — J029 Acute pharyngitis, unspecified: Secondary | ICD-10-CM | POA: Diagnosis present

## 2021-02-22 DIAGNOSIS — R000 Tachycardia, unspecified: Secondary | ICD-10-CM | POA: Diagnosis not present

## 2021-02-22 DIAGNOSIS — D72829 Elevated white blood cell count, unspecified: Secondary | ICD-10-CM | POA: Diagnosis not present

## 2021-02-22 MED ORDER — KETOROLAC TROMETHAMINE 30 MG/ML IJ SOLN
30.0000 mg | Freq: Once | INTRAMUSCULAR | Status: AC
  Administered 2021-02-22: 30 mg via INTRAMUSCULAR
  Filled 2021-02-22: qty 1

## 2021-02-22 MED ORDER — DEXAMETHASONE SODIUM PHOSPHATE 10 MG/ML IJ SOLN
10.0000 mg | Freq: Once | INTRAMUSCULAR | Status: AC
  Administered 2021-02-22: 10 mg via INTRAMUSCULAR
  Filled 2021-02-22: qty 1

## 2021-02-22 NOTE — ED Provider Notes (Signed)
[facility] EMERGENCY DEPARTMENT Provider Note   CSN: [id number] Arrival date & time: 02/22/21  1558     History  Chief Complaint  Patient presents with   Sore Throat    Vicki Mays is a 31 y.o. female.  Patient presents to the emergency department for ongoing sore throat.  Symptoms have been going on for about 3 days.  She was diagnosed with strep throat on 02/19/21 and prescribed clindamycin.  Patient has had decreased oral intake due to pain with swallowing.  She reports occasional fevers and nausea.  She is taking over-the-counter medications antinausea medicine for this without improvement.  The onset of this condition was acute. The course is constant. Aggravating factors: Swallowing. Alleviating factors: none.        Home Medications Prior to Admission medications   Not on File      Allergies    Amoxicillin    Review of Systems   Review of Systems  Constitutional:  Positive for chills, fatigue and fever.  HENT:  Positive for sore throat. Negative for rhinorrhea.   Eyes:  Negative for redness.  Respiratory:  Positive for cough.   Cardiovascular:  Negative for chest pain.  Gastrointestinal:  Positive for nausea. Negative for abdominal pain, diarrhea and vomiting.  Genitourinary:  Negative for dysuria, frequency, hematuria and urgency.  Musculoskeletal:  Negative for myalgias.  Skin:  Negative for rash.  Neurological:  Negative for headaches.   Physical Exam Updated Vital Signs BP 121/82 (BP Location: Left Arm)    Pulse (!) 115    Temp 98.8 F (37.1 C) (Oral)    Resp 20    Ht 5\' 2"  (1.575 m)    Wt 135.6 kg    LMP 02/09/2021    SpO2 97%    BMI 54.69 kg/m  Physical Exam Vitals and nursing note reviewed.  Constitutional:      General: She is not in acute distress.    Appearance: She is well-developed.  HENT:     Head: Normocephalic and atraumatic.     Right Ear: Tympanic membrane, ear canal and external ear normal. Tympanic membrane is not  erythematous.     Left Ear: Tympanic membrane, ear canal and external ear normal. Tympanic membrane is not erythematous.     Nose: Nose normal. No congestion or rhinorrhea.     Mouth/Throat:     Mouth: Mucous membranes are moist.     Pharynx: Posterior oropharyngeal erythema present.     Tonsils: No tonsillar exudate or tonsillar abscesses. 3+ on the right. 3+ on the left.  Eyes:     Conjunctiva/sclera: Conjunctivae normal.  Cardiovascular:     Rate and Rhythm: Regular rhythm. Tachycardia present.     Heart sounds: No murmur heard. Pulmonary:     Effort: No respiratory distress.     Breath sounds: No wheezing, rhonchi or rales.  Abdominal:     Palpations: Abdomen is soft.     Tenderness: There is no abdominal tenderness. There is no guarding or rebound.  Musculoskeletal:     Cervical back: Normal range of motion and neck supple.     Right lower leg: No edema.     Left lower leg: No edema.  Skin:    General: Skin is warm and dry.     Findings: No rash.  Neurological:     General: No focal deficit present.     Mental Status: She is alert. Mental status is at baseline.     Motor: No weakness.  Psychiatric:        Mood and Affect: Mood normal.    ED Results / Procedures / Treatments   Labs (all labs ordered are listed, but only abnormal results are displayed) Labs Reviewed - No data to display  EKG None  Radiology No results found.  Procedures Procedures    Medications Ordered in ED Medications  ketorolac (TORADOL) 30 MG/ML injection 30 mg (has no administration in time range)  dexamethasone (DECADRON) injection 10 mg (has no administration in time range)    ED Course/ Medical Decision Making/ A&P    Patient seen and examined. Plan discussed with patient.  Reviewed patient's labs from New Year's Eve visit.  She tested positive for strep throat.  She had a mildly elevated white blood cell count, normal kidney function and tachycardia on that visit.  Labs:  None  Imaging: None  Medications/Fluids: IM Toradol/IM Decadron  Vital signs reviewed and are as follows: BP 121/82 (BP Location: Left Arm)    Pulse (!) 115    Temp 98.8 F (37.1 C) (Oral)    Resp 20    Ht 5\' 2"  (1.575 m)    Wt 135.6 kg    LMP 02/09/2021    SpO2 97%    BMI 54.69 kg/m   Initial impression: Streptococcal pharyngitis  I discussed possible treatment plans with patient.  This included IV placement for IV fluids and IV medications.  Also discussed foregoing IV fluids and giving Toradol and Decadron IM.  Patient states that she would prefer to go home and would like to have the IM medications.  She states that she has enough nausea medicine at home.  She will continue over-the-counter treatments, rest, and hydration.  Patient urged to return with worsening symptoms or other concerns. Patient verbalized understanding and agrees with plan.                            Medical Decision Making  Patient with recent diagnosis of streptococcal pharyngitis.  Exam is consistent with this.  No signs of developing peritonsillar abscess.  She has good range of motion of her neck and I do not suspect retropharyngeal abscess, epiglottitis, or other deep space infection in the neck.  She is not in any distress and is handling her secretions.  She requires additional symptomatic treatment at this time.  She is taking clindamycin due to amoxicillin allergy.  Treatment plan as above.        Final Clinical Impression(s) / ED Diagnoses Final diagnoses:  None    Rx / DC Orders ED Discharge Orders     None         Carlisle Cater, PA-C 02/22/21 1938    Lennice Sites, DO 02/22/21 2007

## 2021-02-22 NOTE — Discharge Instructions (Signed)
Please read and follow all provided instructions.  Your diagnoses today include:  1. Strep pharyngitis   2. Tachycardia     Tests performed today include: Vital signs. See below for your results today.   Medications prescribed:  You were given an injection of a medicine called Decadron and Toradol.  These are anti-inflammatory and pain medications which will hopefully help your symptoms.  Take any medications prescribed only as directed.   Home care instructions:  Please read the educational materials provided and follow any instructions contained in this packet.  Follow-up instructions: Please follow-up with your primary care provider as needed for further evaluation of your symptoms.  Return instructions:  Please return to the Emergency Department if you experience worsening symptoms.  Return if you have worsening problems swallowing, your neck becomes swollen, you cannot swallow your saliva or your voice becomes muffled.  Return with high persistent fever, persistent vomiting, or if you have trouble breathing.  Please return if you have any other emergent concerns.  Additional Information:  Your vital signs today were: BP 121/82 (BP Location: Left Arm)    Pulse (!) 115    Temp 98.8 F (37.1 C) (Oral)    Resp 20    Ht 5\' 2"  (1.575 m)    Wt 135.6 kg    LMP 02/09/2021    SpO2 97%    BMI 54.69 kg/m  If your blood pressure (BP) was elevated above 135/85 this visit, please have this repeated by your doctor within one month. --------------

## 2021-02-22 NOTE — ED Triage Notes (Addendum)
Pt states she was dx with strep at [facility] ED [facility] 1/1-neg covid/flu -states she has had decreased po intake and n/v-NAD-steady gait

## 2021-02-22 NOTE — ED Notes (Signed)
Pt was dx with strep 1/1 c/o decreased po intake

## 2021-07-31 ENCOUNTER — Other Ambulatory Visit: Payer: Self-pay

## 2021-07-31 ENCOUNTER — Emergency Department (HOSPITAL_BASED_OUTPATIENT_CLINIC_OR_DEPARTMENT_OTHER)
Admission: EM | Admit: 2021-07-31 | Discharge: 2021-07-31 | Disposition: A | Discharge status: Home / Self Care | Payer: PRIVATE HEALTH INSURANCE | Attending: Emergency Medicine | Admitting: Emergency Medicine

## 2021-07-31 ENCOUNTER — Encounter (HOSPITAL_BASED_OUTPATIENT_CLINIC_OR_DEPARTMENT_OTHER): Payer: Self-pay | Admitting: Emergency Medicine

## 2021-07-31 DIAGNOSIS — J020 Streptococcal pharyngitis: Secondary | ICD-10-CM | POA: Diagnosis not present

## 2021-07-31 DIAGNOSIS — J029 Acute pharyngitis, unspecified: Secondary | ICD-10-CM

## 2021-07-31 LAB — GROUP A STREP BY PCR: Group A Strep by PCR: DETECTED — AB

## 2021-07-31 MED ORDER — OXYCODONE-ACETAMINOPHEN 5-325 MG PO TABS: 1.0000 | ORAL_TABLET | ORAL | 0 refills | Status: DC | PRN

## 2021-07-31 MED ORDER — DEXAMETHASONE SODIUM PHOSPHATE 10 MG/ML IJ SOLN
10.0000 mg | Freq: Once | INTRAMUSCULAR | Status: AC
  Administered 2021-07-31: 10 mg via INTRAMUSCULAR
  Filled 2021-07-31: qty 1

## 2021-07-31 MED ORDER — CLINDAMYCIN HCL 300 MG PO CAPS: 300.0000 mg | ORAL_CAPSULE | Freq: Three times a day (TID) | ORAL | 0 refills | Status: AC

## 2021-07-31 NOTE — Discharge Instructions (Addendum)
Your history, exam, work-up today are consistent with strep throat causing your symptoms.  Please take the antibiotics for the next 10 days to treat and rest and stay hydrated.  Please use pain medicine help with discomfort.  If any symptoms change or worsen acutely, please return to the nearest emergency department.  Please follow-up with ENT if symptoms persist.

## 2021-07-31 NOTE — ED Triage Notes (Signed)
Nasal congestion and sore throat, body aches x 10 days .

## 2021-07-31 NOTE — ED Notes (Signed)
Pt sts she was able to get a little water down, but it hurts "really bad."

## 2021-07-31 NOTE — ED Notes (Signed)
Water provided for po challenge 

## 2021-07-31 NOTE — ED Provider Notes (Signed)
[facility] EMERGENCY DEPARTMENT Provider Note   CSN: [id number] Arrival date & time: 07/31/21  6160     History  Chief Complaint  Patient presents with   Sore Throat    Vicki Mays is a 31 y.o. female.  The history is provided by the patient and medical records. No language interpreter was used.  Sore Throat This is a new problem. The current episode started more than 2 days ago. The problem occurs constantly. The problem has been gradually worsening. Pertinent negatives include no chest pain, no abdominal pain, no headaches and no shortness of breath. Nothing aggravates the symptoms. Nothing relieves the symptoms. She has tried nothing for the symptoms. The treatment provided no relief.       Home Medications Prior to Admission medications   Not on File      Allergies    Amoxicillin    Review of Systems   Review of Systems  Constitutional:  Positive for chills. Negative for fatigue and fever.  HENT:  Positive for congestion and sore throat.   Eyes:  Negative for visual disturbance.  Respiratory:  Negative for cough, chest tightness, shortness of breath and wheezing.   Cardiovascular:  Negative for chest pain and palpitations.  Gastrointestinal:  Negative for abdominal pain, constipation, diarrhea, nausea and vomiting.  Genitourinary:  Negative for dysuria and flank pain.  Musculoskeletal:  Negative for back pain, neck pain and neck stiffness.  Skin:  Negative for rash and wound.  Neurological:  Negative for dizziness, weakness, light-headedness and headaches.  Psychiatric/Behavioral:  Negative for agitation and confusion.   All other systems reviewed and are negative.   Physical Exam Updated Vital Signs BP (!) 132/100 (BP Location: Left Arm)   Pulse (!) 114   Temp 98.5 F (36.9 C) (Oral)   Resp (!) 22   Ht 5\' 2"  (1.575 m)   Wt 127 kg   LMP 07/14/2021   SpO2 99%   BMI 51.21 kg/m  Physical Exam Vitals and nursing note reviewed.   Constitutional:      General: She is not in acute distress.    Appearance: She is well-developed. She is not ill-appearing, toxic-appearing or diaphoretic.  HENT:     Head: Normocephalic and atraumatic.     Nose: No congestion or rhinorrhea.     Mouth/Throat:     Mouth: Mucous membranes are moist.     Pharynx: Uvula midline. Oropharyngeal exudate and posterior oropharyngeal erythema present. No uvula swelling.     Tonsils: Tonsillar exudate present. No tonsillar abscesses.  Eyes:     Conjunctiva/sclera: Conjunctivae normal.  Neck:     Vascular: No carotid bruit.  Cardiovascular:     Rate and Rhythm: Normal rate and regular rhythm.     Heart sounds: No murmur heard. Pulmonary:     Effort: Pulmonary effort is normal. No respiratory distress.     Breath sounds: Normal breath sounds. No wheezing, rhonchi or rales.  Chest:     Chest wall: No tenderness.  Abdominal:     Palpations: Abdomen is soft.     Tenderness: There is no abdominal tenderness. There is no right CVA tenderness, left CVA tenderness, guarding or rebound.  Musculoskeletal:        General: No swelling or tenderness.     Cervical back: Neck supple. No tenderness.  Skin:    General: Skin is warm and dry.     Capillary Refill: Capillary refill takes less than 2 seconds.     Findings: No  erythema or rash.  Neurological:     General: No focal deficit present.     Mental Status: She is alert.     Sensory: No sensory deficit.     Motor: No weakness.  Psychiatric:        Mood and Affect: Mood normal.     ED Results / Procedures / Treatments   Labs (all labs ordered are listed, but only abnormal results are displayed) Labs Reviewed  GROUP A STREP BY PCR - Abnormal; Notable for the following components:      Result Value   Group A Strep by PCR DETECTED (*)    All other components within normal limits    EKG None  Radiology No results found.  Procedures Procedures    Medications Ordered in  ED Medications  dexamethasone (DECADRON) injection 10 mg (10 mg Intramuscular Given 07/31/21 1155)    ED Course/ Medical Decision Making/ A&P                           Medical Decision Making Risk Prescription drug management.    Vicki Mays is a 31 y.o. female with a past medical history significant for previous pulmonary embolism and obesity who presents with just over 1 week of sore throat, intermittent fevers, congestion, and dry cough.  Patient says that the sore throat has been her biggest problem and she has had some pain with swallowing although she is able to eat and drink.  She reports some subjective fevers on and off but is afebrile on arrival.  She reports no nausea, vomiting, constipation, or diarrhea.  Denies urinary changes.  Does not report any tick exposures or rashes.  She reports some soreness in her throat but denies significant posterior neck pain or headaches.  Denies any focal neurologic complaints.  Denies difficulty breathing or chest pain.  On exam, lungs clear.  Chest nontender.  Abdomen nontender.  Patient does have some congestion as well as posterior oropharyngeal erythema and tonsillar exudates.  Uvula is midline and there is no evidence of PTA or RPA.  No stridor.  Neck was slightly sore on the front but did not have any tenderness in the back.  She had normal neck range of motion.  No focal neurologic deficits.  Exam otherwise unremarkable.  Clinically I do suspect patient has strep throat and a strep test was positive.  Given her lack of asymmetry on exam and her ability to still eat and drink I have less suspicion for PTA at this time.  We discussed getting her a shot of Decadron and due to her penicillin allergy will need other antibiotics.  We will likely also give her prescription for pain medicine given the discomfort.  If patient is able to pass a p.o. challenge after Decadron anticipate discharge home.  We had a shared decision-making conversation  about getting blood work or even CT imaging to rule out PTA or RPA but have low suspicion for it and she agrees to hold on that work-up at this time.  We will likely give recommendations for outpatient follow-up with ENT and PCP.  Anticipate reassessment after p.o. challenge.   Patient passed p.o. challenge without difficulty and is feeling better.  She will be given Motrin for pain medicine and antibiotics for strep throat causing her symptoms.  Clinically she did not have evidence of PTA or RPA and she agrees to hold on further work-up at this time.  Patient will follow-up  with PCP and was given instructions for ENT follow-up if it persist.  She no other questions or concerns and was discharged in good condition.          Final Clinical Impression(s) / ED Diagnoses Final diagnoses:  Strep pharyngitis  Sore throat    Rx / DC Orders ED Discharge Orders          Ordered    oxyCODONE-acetaminophen (PERCOCET/ROXICET) 5-325 MG tablet  Every 4 hours PRN        07/31/21 1330    clindamycin (CLEOCIN) 300 MG capsule  3 times daily        07/31/21 1330            Clinical Impression: 1. Strep pharyngitis   2. Sore throat     Disposition: Discharge  Condition: Good  I have discussed the results, Dx and Tx plan with the pt(& family if present). He/she/they expressed understanding and agree(s) with the plan. Discharge instructions discussed at great length. Strict return precautions discussed and pt &/or family have verbalized understanding of the instructions. No further questions at time of discharge.    Discharge Medication List as of 07/31/2021  1:30 PM     START taking these medications   Details  clindamycin (CLEOCIN) 300 MG capsule Take 1 capsule (300 mg total) by mouth 3 (three) times daily for 10 days., Starting Sun 07/31/2021, Until Wed 08/10/2021, Normal    oxyCODONE-acetaminophen (PERCOCET/ROXICET) 5-325 MG tablet Take 1 tablet by mouth every 4 (four) hours as  needed for severe pain., Starting Sun 07/31/2021, Normal        Follow Up: Laren Boom, DO [address] [place] [address] [place] [address] [phone]   with ENT  [facility] [place] [facility] [address] [id number] [address] [place] [address] [phone]        Teyonna Plaisted, Canary Brim, MD 07/31/21 1537

## 2021-08-01 ENCOUNTER — Telehealth (HOSPITAL_BASED_OUTPATIENT_CLINIC_OR_DEPARTMENT_OTHER): Payer: Self-pay | Admitting: Emergency Medicine

## 2021-08-01 MED ORDER — MORPHINE SULFATE 15 MG PO TABS: 7.5000 mg | ORAL_TABLET | ORAL | 0 refills | Status: AC | PRN

## 2021-08-01 NOTE — Telephone Encounter (Signed)
Patient called because the pharmacy did not have narcotic medicine and requested be sent to a different pharmacy.

## 2022-12-16 IMAGING — CT CT ANGIO CHEST
3 of 9 series · 18 of 36 positions shown · IV contrast (Omnipaque)
Comparison: CT Abdomen and Pelvis 03/11/2009.

CLINICAL DATA: 29-year-old female with left side chest pain, no
known injury.

EXAM:
CT ANGIOGRAPHY CHEST WITH CONTRAST
TECHNIQUE: Multidetector CT imaging of the chest was performed using the
standard protocol during bolus administration of intravenous
contrast. Multiplanar CT image reconstructions and MIPs were
obtained to evaluate the vascular anatomy.
CONTRAST:  100mL OMNIPAQUE IOHEXOL 350 MG/ML SOLN

[Series 5: pe lung · axial · 0.98mm/px · z∈[-228,-138]mm · 2 of 90 slices shown]
[im 30/90  mediastinal]
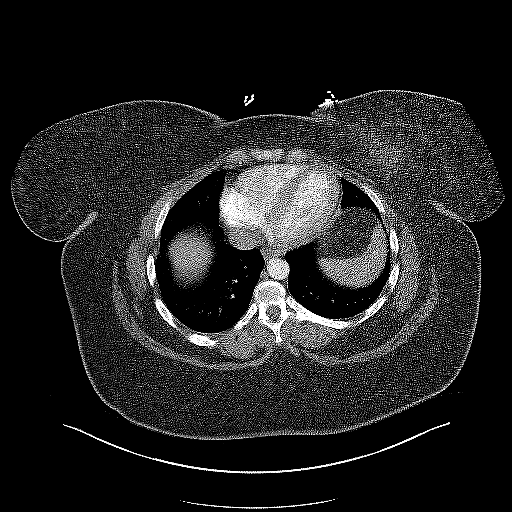
[im 60/90  mediastinal]
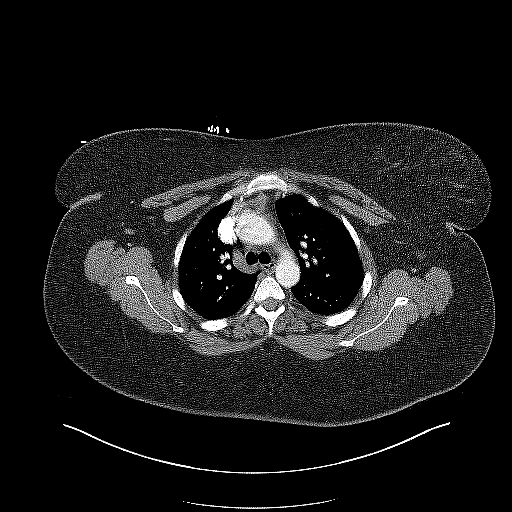

[Series 6: pe coronal mpr · coronal · 0.59mm/px · 1 of 151 slices shown]
[im 76/151  mediastinal]
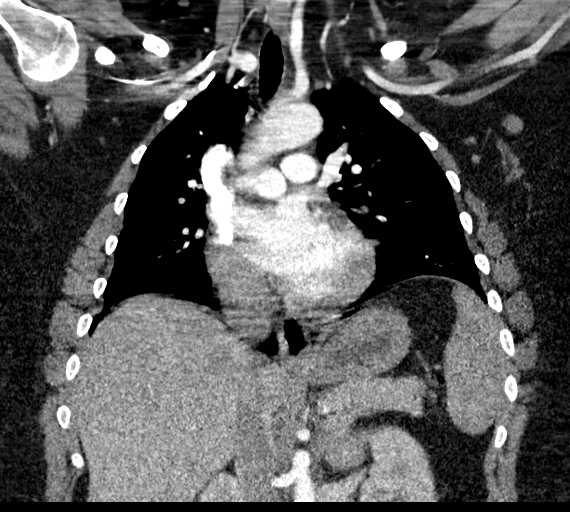

[Series 10: pe thins · axial · 0.98mm/px · z∈[-326,-68]mm · 15 of 296 slices shown]
[im 19/296  lung]
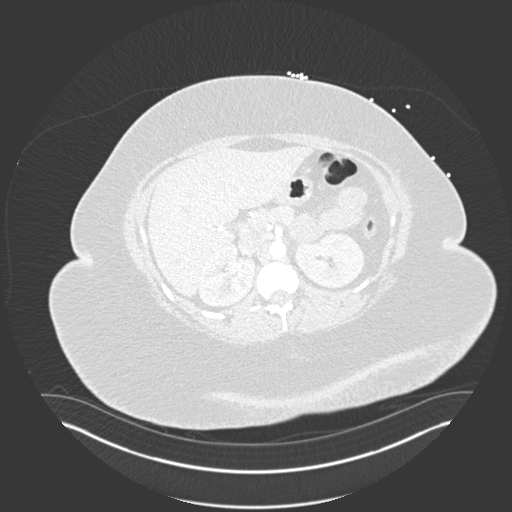
[im 37/296  mediastinal]
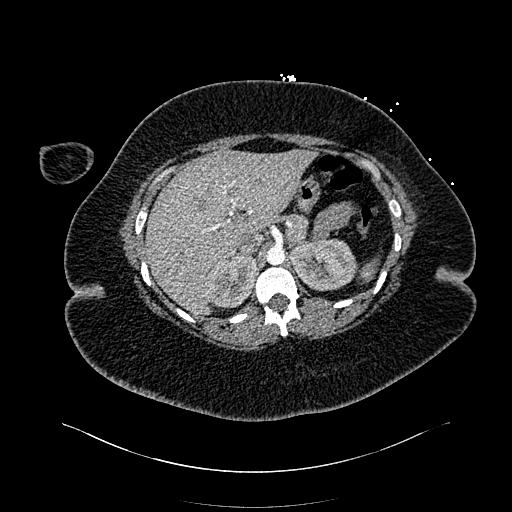
[im 56/296  lung]
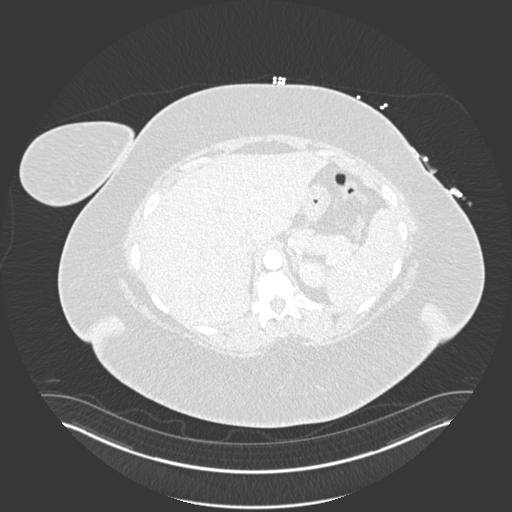
[im 74/296  mediastinal]
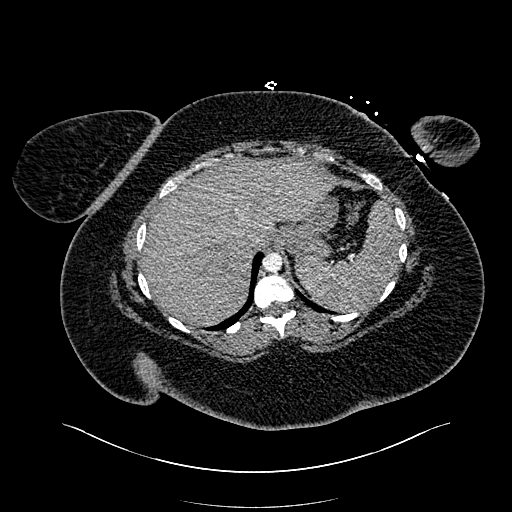
[im 93/296  lung]
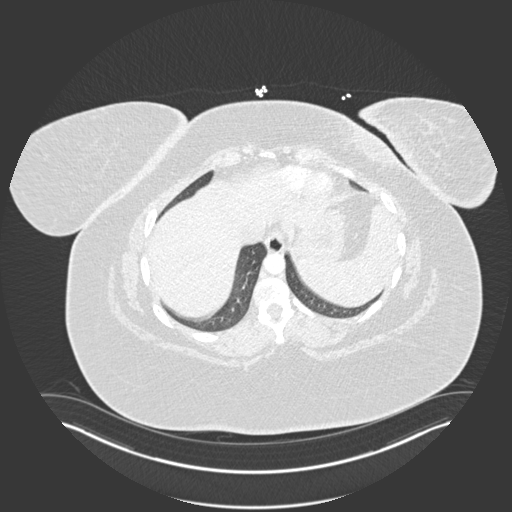
[im 111/296  mediastinal]
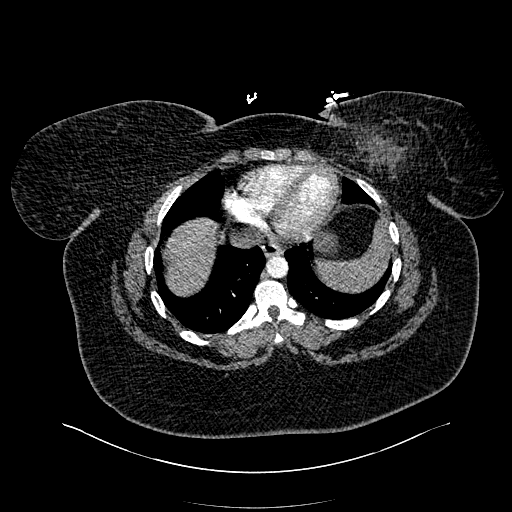
[im 130/296  lung]
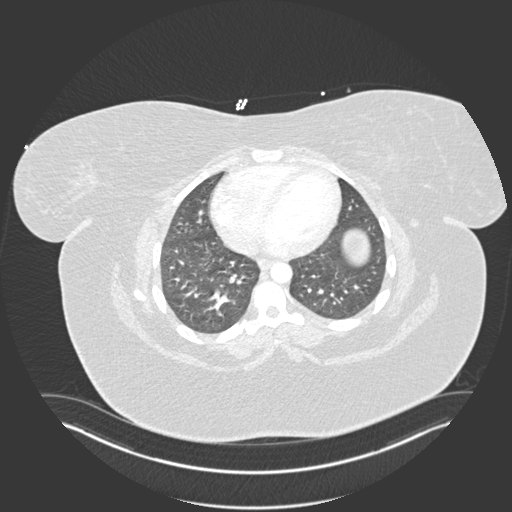
[im 148/296  mediastinal]
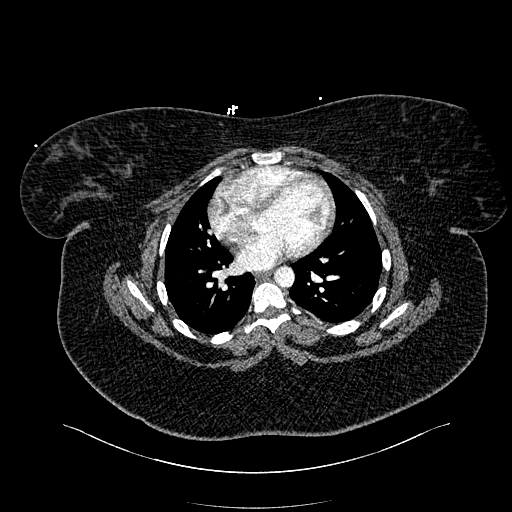
[im 166/296  lung]
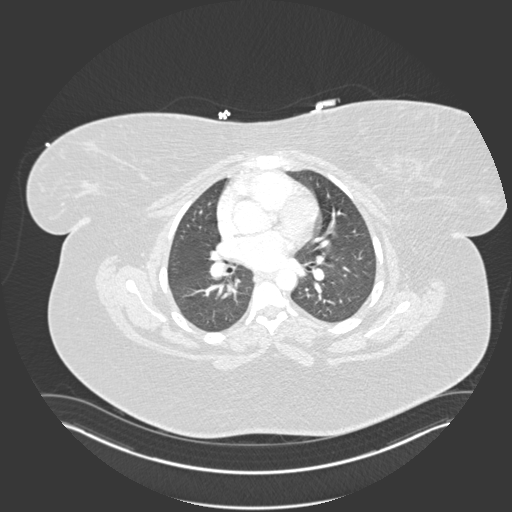
[im 185/296  mediastinal]
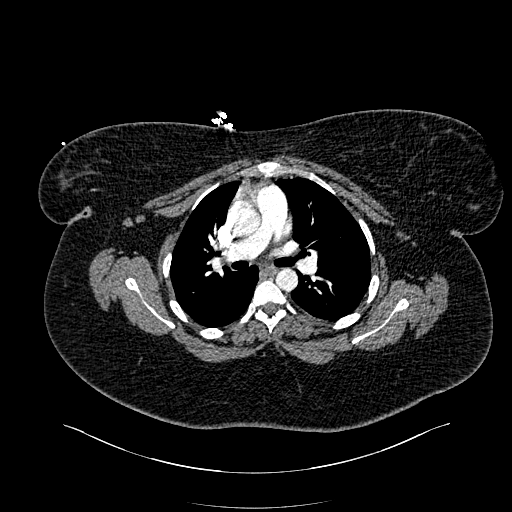
[im 203/296  lung]
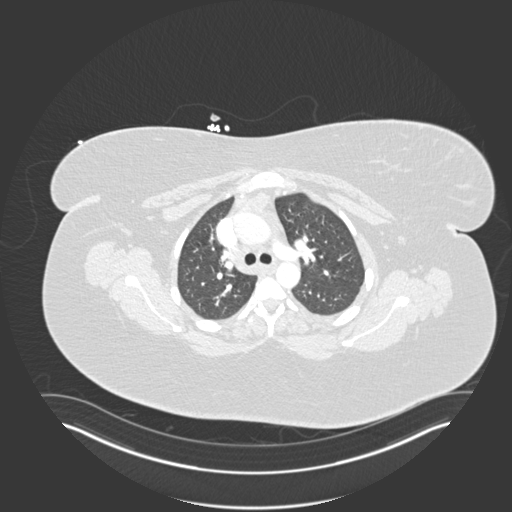
[im 222/296  mediastinal]
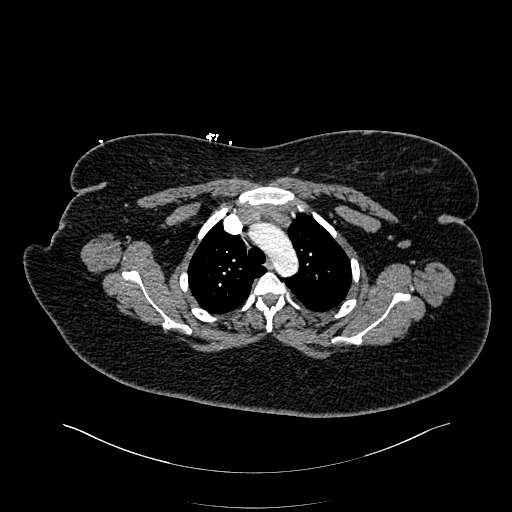
[im 240/296  lung]
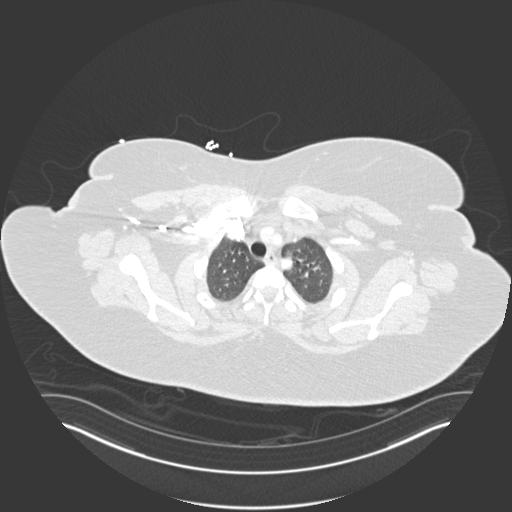
[im 259/296  mediastinal]
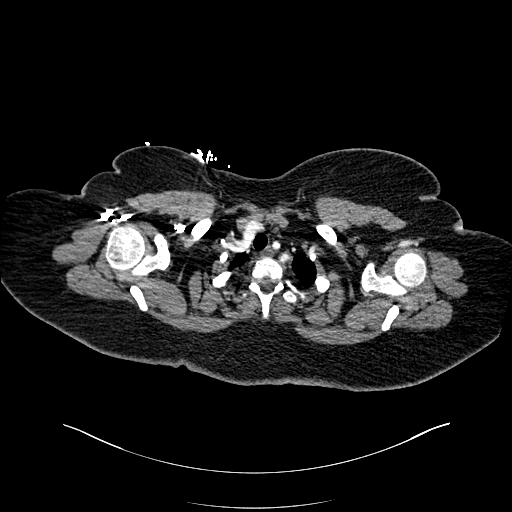
[im 277/296  lung]
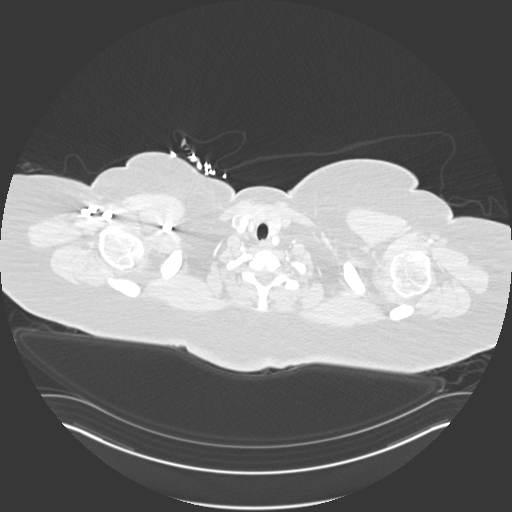

[18 of 36 positions shown; findings below may reference images not displayed]

FINDINGS: Cardiovascular: Adequate contrast bolus timing in the pulmonary
arterial tree. Mild respiratory motion. No pulmonary artery filling
defect identified to the distal segmental branches. Upper lobe
subsegmental branches also appear patent. Middle and lower lobe
subsegmental and distal branches are not well evaluated due to
combined motion and contrast timing.

No cardiomegaly or pericardial effusion. Normal visible aorta. No
calcified coronary artery atherosclerosis is evident.

Mediastinum/Nodes: Negative. No mediastinal lymphadenopathy. Small
volume residual thymus.

Lungs/Pleura: Major airways are patent and both lungs appear clear.
No pleural effusion or abnormal pulmonary opacity.

Upper Abdomen: Stable and negative visible liver, spleen, pancreas,
adrenal glands, kidneys and bowel in the upper abdomen.

Musculoskeletal: No osseous abnormality identified. Superficial soft
tissue swelling and stranding along the crease of the left lower
breast in an area of 5-6 cm (series 4, image 65 and series 6, image
121). No soft tissue gas. No drainable fluid.

Review of the MIP images confirms the above findings.
IMPRESSION: 1. No pulmonary embolus identified, middle and lower lobe
subsegmental and distal branches are obscured.
2. Superficial soft tissue swelling and stranding in a 5-6 cm area
along the crease of the left lower breast compatible with
cellulitis, phlegmon. No drainable fluid collection or soft tissue
gas.
3. Otherwise negative CT appearance of the chest.

## 2023-01-27 ENCOUNTER — Emergency Department (HOSPITAL_BASED_OUTPATIENT_CLINIC_OR_DEPARTMENT_OTHER): Payer: PRIVATE HEALTH INSURANCE

## 2023-01-27 ENCOUNTER — Emergency Department (HOSPITAL_BASED_OUTPATIENT_CLINIC_OR_DEPARTMENT_OTHER)
Admission: EM | Admit: 2023-01-27 | Discharge: 2023-01-27 | Disposition: A | Discharge status: Home / Self Care | Payer: PRIVATE HEALTH INSURANCE | Attending: Emergency Medicine | Admitting: Emergency Medicine

## 2023-01-27 ENCOUNTER — Encounter (HOSPITAL_BASED_OUTPATIENT_CLINIC_OR_DEPARTMENT_OTHER): Payer: Self-pay

## 2023-01-27 ENCOUNTER — Other Ambulatory Visit: Payer: Self-pay

## 2023-01-27 DIAGNOSIS — R110 Nausea: Secondary | ICD-10-CM | POA: Insufficient documentation

## 2023-01-27 DIAGNOSIS — R1013 Epigastric pain: Secondary | ICD-10-CM | POA: Diagnosis not present

## 2023-01-27 DIAGNOSIS — R1012 Left upper quadrant pain: Secondary | ICD-10-CM | POA: Insufficient documentation

## 2023-01-27 LAB — COMPREHENSIVE METABOLIC PANEL
ALT: 19 U/L (ref 0–44)
AST: 20 U/L (ref 15–41)
Albumin: 3.6 g/dL (ref 3.5–5.0)
Alkaline Phosphatase: 68 U/L (ref 38–126)
Anion gap: 8 (ref 5–15)
BUN: 10 mg/dL (ref 6–20)
CO2: 22 mmol/L (ref 22–32)
Calcium: 8.5 mg/dL — ABNORMAL LOW (ref 8.9–10.3)
Chloride: 105 mmol/L (ref 98–111)
Creatinine, Ser: 0.72 mg/dL (ref 0.44–1.00)
GFR, Estimated: >60 mL/min (ref >60)
Glucose, Bld: 83 mg/dL (ref 70–99)
Potassium: 4.1 mmol/L (ref 3.5–5.1)
Sodium: 135 mmol/L (ref 135–145)
Total Bilirubin: 0.4 mg/dL (ref <1.2)
Total Protein: 8.2 g/dL — ABNORMAL HIGH (ref 6.5–8.1)

## 2023-01-27 LAB — CBC
HCT: 40.6 % (ref 36.0–46.0)
Hemoglobin: 12.1 g/dL (ref 12.0–15.0)
MCH: 23.2 pg — ABNORMAL LOW (ref 26.0–34.0)
MCHC: 29.8 g/dL — ABNORMAL LOW (ref 30.0–36.0)
MCV: 77.8 fL — ABNORMAL LOW (ref 80.0–100.0)
Platelets: 253 10*3/uL (ref 150–400)
RBC: 5.22 MIL/uL — ABNORMAL HIGH (ref 3.87–5.11)
RDW: 16.1 % — ABNORMAL HIGH (ref 11.5–15.5)
WBC: 10.8 10*3/uL — ABNORMAL HIGH (ref 4.0–10.5)
nRBC: 0 % (ref 0.0–0.2)

## 2023-01-27 LAB — URINALYSIS, MICROSCOPIC (REFLEX): RBC / HPF: NONE SEEN RBC/hpf (ref 0–5)

## 2023-01-27 LAB — URINALYSIS, ROUTINE W REFLEX MICROSCOPIC
Bilirubin Urine: NEGATIVE
Glucose, UA: NEGATIVE mg/dL
Hgb urine dipstick: NEGATIVE
Ketones, ur: NEGATIVE mg/dL
Leukocytes,Ua: NEGATIVE
Nitrite: NEGATIVE
Protein, ur: 100 mg/dL — AB
Specific Gravity, Urine: >=1.03 (ref 1.005–1.030)
pH: 6 (ref 5.0–8.0)

## 2023-01-27 LAB — WET PREP, GENITAL
Sperm: NONE SEEN
Trich, Wet Prep: NONE SEEN
WBC, Wet Prep HPF POC: <10 (ref <10)
Yeast Wet Prep HPF POC: NONE SEEN

## 2023-01-27 LAB — LIPASE, BLOOD: Lipase: 28 U/L (ref 11–51)

## 2023-01-27 LAB — PREGNANCY, URINE: Preg Test, Ur: NEGATIVE

## 2023-01-27 MED ORDER — IBUPROFEN 800 MG PO TABS
800.0000 mg | ORAL_TABLET | Freq: Once | ORAL | Status: AC
Start: 2023-01-27 — End: 2023-01-27
  Administered 2023-01-27: 800 mg via ORAL
  Filled 2023-01-27: qty 1

## 2023-01-27 MED ORDER — ONDANSETRON HCL 4 MG/2ML IJ SOLN
4.0000 mg | Freq: Once | INTRAMUSCULAR | Status: AC
  Administered 2023-01-27: 4 mg via INTRAVENOUS
  Filled 2023-01-27: qty 2

## 2023-01-27 MED ORDER — SODIUM CHLORIDE 0.9 % IV BOLUS
1000.0000 mL | Freq: Once | INTRAVENOUS | Status: AC
Start: 2023-01-27 — End: 2023-01-27
  Administered 2023-01-27: 1000 mL via INTRAVENOUS

## 2023-01-27 MED ORDER — IOHEXOL 300 MG/ML  SOLN
100.0000 mL | Freq: Once | INTRAMUSCULAR | Status: AC | PRN
  Administered 2023-01-27: 100 mL via INTRAVENOUS

## 2023-01-27 NOTE — ED Notes (Signed)
Reviewed D/C information with the patient, pt verbalized understanding. No additional concerns at this time.

## 2023-01-27 NOTE — ED Triage Notes (Signed)
Pt with cramping epigastric and LUQ pain x2 weeks. Pt states she feels bloated. Endorses N/V and belching. No BM issues. Pt reports that this has never occurred before. Pt unsure if she is pregnant, not trying, but has taken tests.

## 2023-01-27 NOTE — Discharge Instructions (Signed)
Begin taking omeprazole 20 mg twice daily for the next 2 weeks.  Take ibuprofen 600 mg every 6 hours as needed for pain.  Follow-up the results of the remaining tests through MyChart.  Follow-up with primary doctor if not improving.

## 2023-01-27 NOTE — ED Provider Notes (Signed)
[facility] Provider Note   CSN: [id number] Arrival date & time: 01/27/23  4742     History  Chief Complaint  Patient presents with   Abdominal Pain    Kayelee Abid is a 32 y.o. female.  Patient is a 32 year old female with past medical history of breast abscess.  Patient presenting today for evaluation of left upper quadrant and epigastric pain.  This has been worsening over the past week.  She describes feeling "bloated".  This is worse when she attempts to eat or drink.  This makes her feel nauseated, but has not vomited.  No diarrhea.  No urinary complaints.  She cannot recall her last menstrual period, but has very irregular periods at baseline.  The history is provided by the patient.       Home Medications Prior to Admission medications   Medication Sig Start Date End Date Taking? Authorizing Provider  morphine (MSIR) 15 MG tablet Take 0.5 tablets (7.5 mg total) by mouth every 4 (four) hours as needed for severe pain. 08/01/21   Melene Plan, DO      Allergies    Amoxicillin    Review of Systems   Review of Systems  All other systems reviewed and are negative.   Physical Exam Updated Vital Signs BP 133/87 (BP Location: Right Arm)   Pulse 69   Temp 97.9 F (36.6 C) (Oral)   Resp 20   Ht 5\' 2"  (1.575 m)   Wt (!) 140.6 kg   LMP 12/27/2022 (Exact Date)   SpO2 100%   BMI 56.70 kg/m  Physical Exam Vitals and nursing note reviewed.  Constitutional:      General: She is not in acute distress.    Appearance: She is well-developed. She is not diaphoretic.  HENT:     Head: Normocephalic and atraumatic.  Cardiovascular:     Rate and Rhythm: Normal rate and regular rhythm.     Heart sounds: No murmur heard.    No friction rub. No gallop.  Pulmonary:     Effort: Pulmonary effort is normal. No respiratory distress.     Breath sounds: Normal breath sounds. No wheezing.  Abdominal:     General: Bowel sounds are  normal. There is no distension.     Palpations: Abdomen is soft.     Tenderness: There is abdominal tenderness in the epigastric area and left upper quadrant. There is no right CVA tenderness, left CVA tenderness, guarding or rebound.  Musculoskeletal:        General: Normal range of motion.     Cervical back: Normal range of motion and neck supple.  Skin:    General: Skin is warm and dry.  Neurological:     General: No focal deficit present.     Mental Status: She is alert and oriented to person, place, and time.     ED Results / Procedures / Treatments   Labs (all labs ordered are listed, but only abnormal results are displayed) Labs Reviewed  CBC - Abnormal; Notable for the following components:      Result Value   WBC 10.8 (*)    RBC 5.22 (*)    MCV 77.8 (*)    MCH 23.2 (*)    MCHC 29.8 (*)    RDW 16.1 (*)    All other components within normal limits  LIPASE, BLOOD  COMPREHENSIVE METABOLIC PANEL  URINALYSIS, ROUTINE W REFLEX MICROSCOPIC  PREGNANCY, URINE    EKG None  Radiology No results found.  Procedures Procedures    Medications Ordered in ED Medications  sodium chloride 0.9 % bolus 1,000 mL (has no administration in time range)  ondansetron (ZOFRAN) injection 4 mg (4 mg Intravenous Given 01/27/23 0328)    ED Course/ Medical Decision Making/ A&P  Patient is a 32 year old female presenting with complaints of epigastric and left upper quadrant pain as described in the HPI.  Patient arrives with stable vital signs and is afebrile.  There is tenderness to palpation to the left upper quadrant and epigastric region.  Laboratory studies obtained including CBC, CMP, lipase, all of which are unremarkable.  She does have a very mild white count, but no other abnormality.  Urinalysis inconsistent with infection and pregnancy test is negative.  CT scan of the abdomen and pelvis obtained showing no acute intra-abdominal process.  It does show an incidental finding  within the liver for which an MRI has been recommended.  This will be ordered as an outpatient and followed up by her primary doctor.  Patient to be discharged, but however also expresses concerns about the possibility of STD.  A vaginal swab was obtained and sent to the lab.  It was positive for clue cells, however patient is not complaining of any discharge.  Patient will be discharged with pending GC/chlamydia testing.  Final Clinical Impression(s) / ED Diagnoses Final diagnoses:  None    Rx / DC Orders ED Discharge Orders     None         Geoffery Lyons, MD 01/27/23 [phone]

## 2023-01-29 ENCOUNTER — Telehealth (HOSPITAL_BASED_OUTPATIENT_CLINIC_OR_DEPARTMENT_OTHER): Payer: Self-pay | Admitting: Emergency Medicine

## 2023-01-29 DIAGNOSIS — B9689 Other specified bacterial agents as the cause of diseases classified elsewhere: Secondary | ICD-10-CM

## 2023-01-29 LAB — GC/CHLAMYDIA PROBE AMP (~~LOC~~) NOT AT ARMC
Chlamydia: NEGATIVE
Comment: NEGATIVE
Comment: NORMAL
Neisseria Gonorrhea: NEGATIVE

## 2023-01-29 MED ORDER — METRONIDAZOLE 500 MG PO TABS
500.0000 mg | ORAL_TABLET | Freq: Two times a day (BID) | ORAL | 0 refills | Status: AC
Start: 2023-01-29 — End: ?

## 2023-01-29 NOTE — Telephone Encounter (Cosign Needed)
Patient called this location to report that she was seen 2 days ago and had some testing that was positive and needed a prescription sent to pharmacy.  I reviewed patient's ER encounter from 12/7, her wet prep was positive for clue cells, indicative of bacterial vaginosis.  Urine pregnancy was negative. Gonorrhea and chlamydia testing was negative. Will send prescription for Flagyl to the pharmacy.  Patient notified.

## 2023-01-30 IMAGING — US US BREAST*L* LIMITED INC AXILLA
1 series · 4 of 4 positions shown · non-contrast
Comparison: None.

CLINICAL DATA: 29-year-old female with a recurrent left breast
abscess.

EXAM:
ULTRASOUND OF THE LEFT BREAST

[Series 1: us breast*left* limited inc axilla · 0.06mm/px · 4 of 4 slices shown]
[im 1/4]
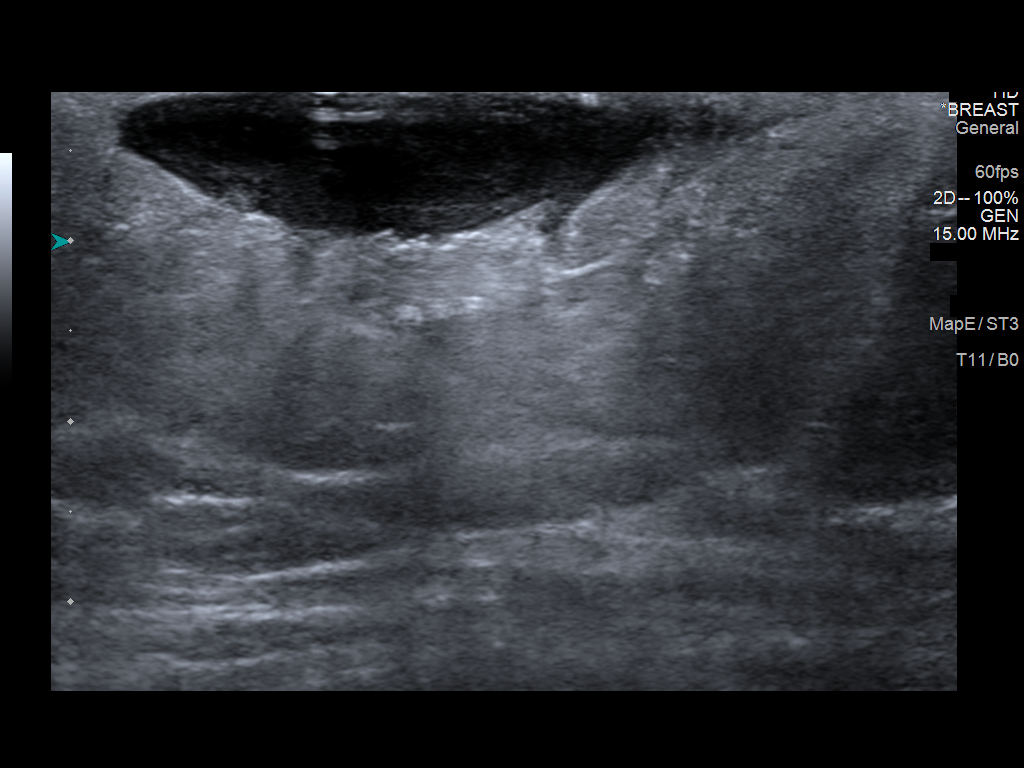
[im 2/4]
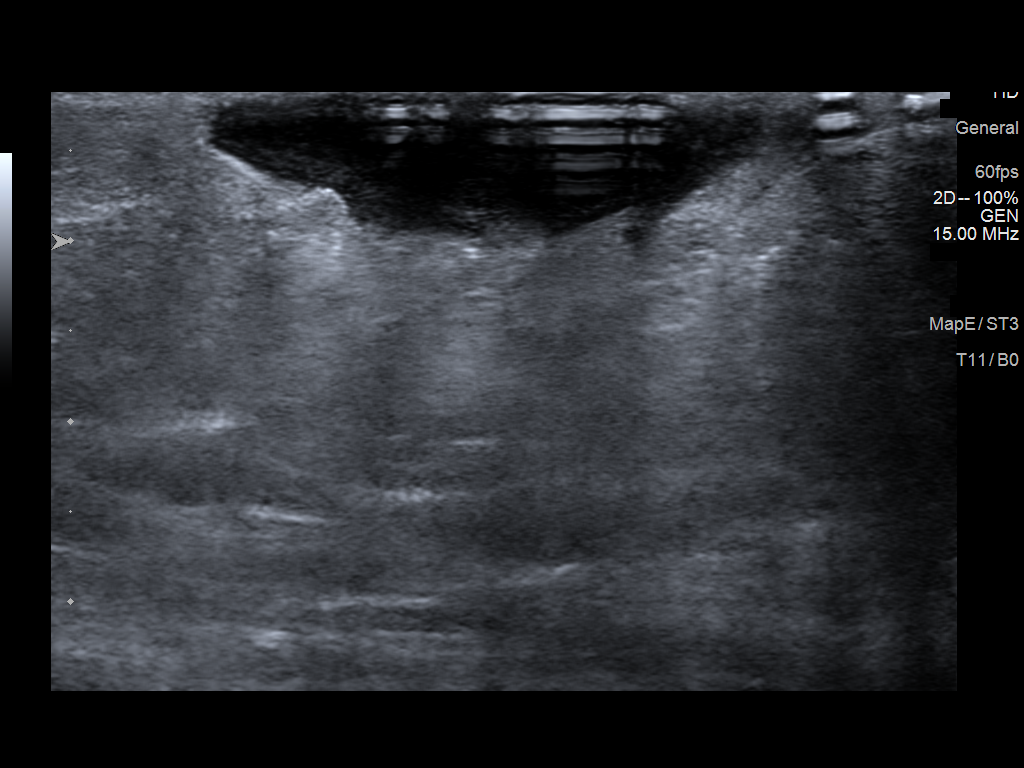
[im 3/4]
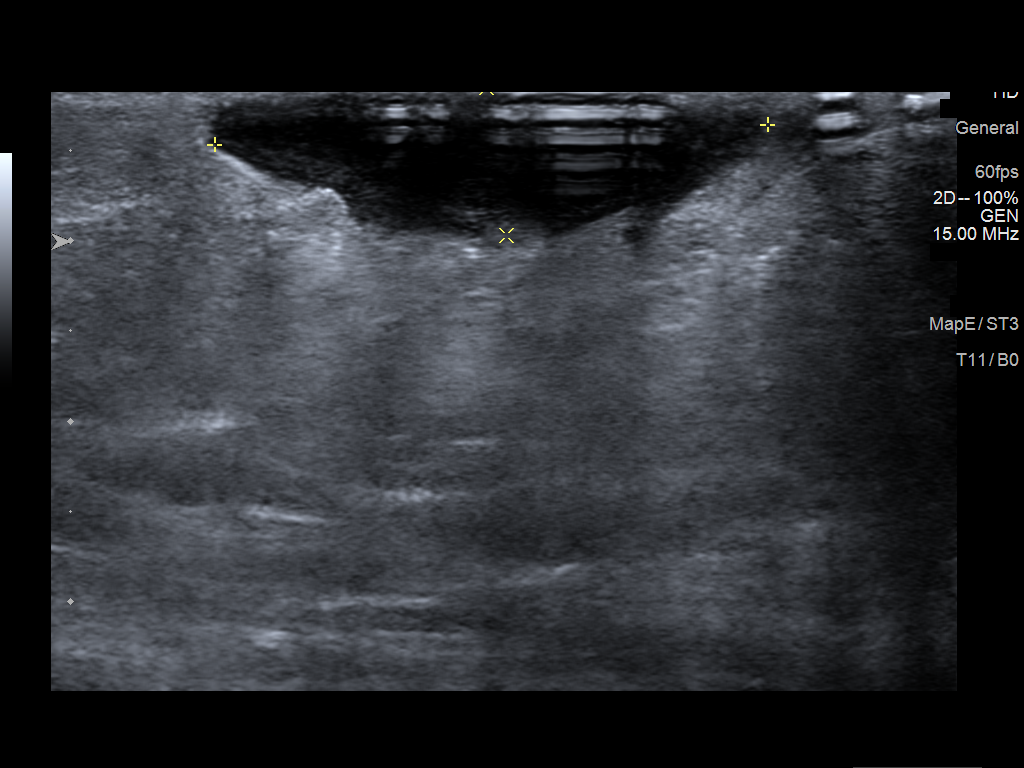
[im 4/4]
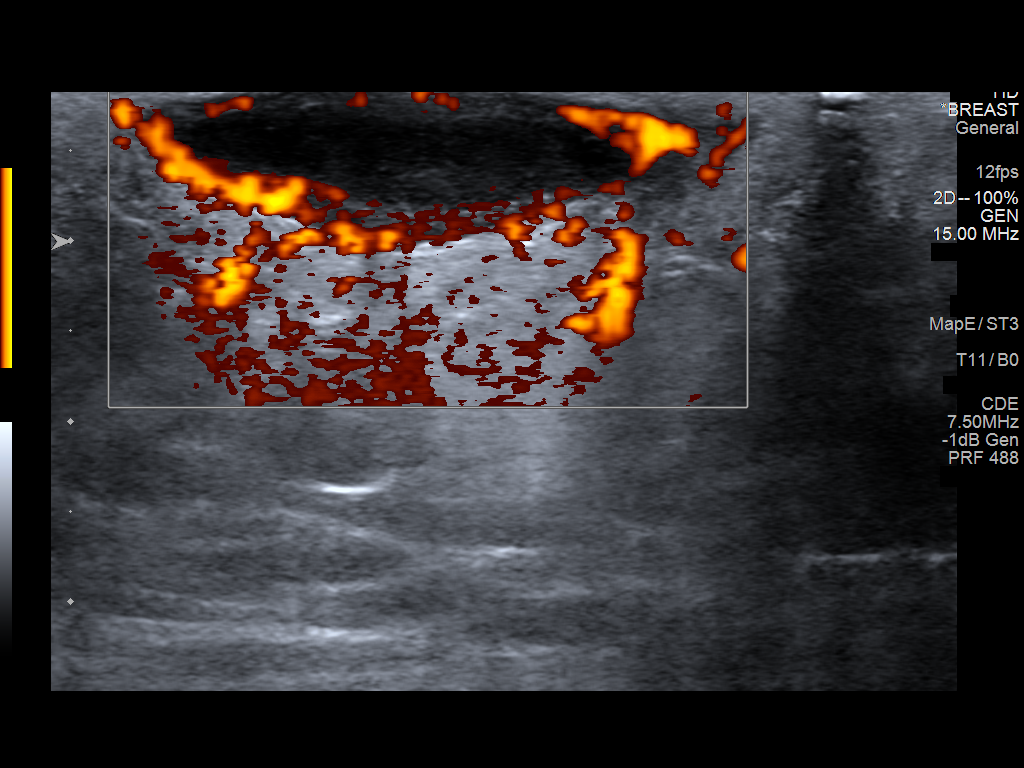

[4 of 4 positions shown; findings below may reference images not displayed]

FINDINGS: On physical exam, there is tenderness in overlying skin induration
along the inframammary fold of the left breast.

Targeted ultrasound is performed, showing a superficial hypoechoic
collection along the 4 o'clock position of the inframammary fold. It
measures approximately 3 x 3 x 0.8 cm. There is marked peripheral
but no internal vascularity. This is consistent with a small
recurrent abscess of the left breast.
IMPRESSION: Small recurrent abscess of the left breast along the IMF.

RECOMMENDATION:
1. Ultrasound-guided aspiration of the patient's recurrent abscess.
This was performed to follow. Please see additional dictation.
2. Additionally, the patient will be prescribed a course of
antibiotics by her ordering provider.
3. We will follow-up with her in 1 week for repeat ultrasound
evaluation. Repeat aspiration can be performed at that time if
needed.

I have discussed the findings and recommendations with the patient.
If applicable, a reminder letter will be sent to the patient
regarding the next appointment.

BI-RADS CATEGORY  2: Benign.

## 2023-04-08 IMAGING — CT CT ABD-PELV W/ CM
2 of 4 series · 16 of 46 positions shown, 18 images · IV contrast (Omnipaque)
Comparison: Remote CT abdomen 03/11/2009

CLINICAL DATA: RIGHT lower quadrant pain. Concern for appendicitis.

EXAM:
CT ABDOMEN AND PELVIS WITH CONTRAST
TECHNIQUE: Multidetector CT imaging of the abdomen and pelvis was performed
using the standard protocol following bolus administration of
intravenous contrast.
CONTRAST:  100mL OMNIPAQUE IOHEXOL 300 MG/ML  SOLN

[Series 2: axial st · axial · 0.98mm/px · z∈[-502,-42]mm · 13 of 101 slices shown, 15 images]
[im 5/101  soft-tissue]
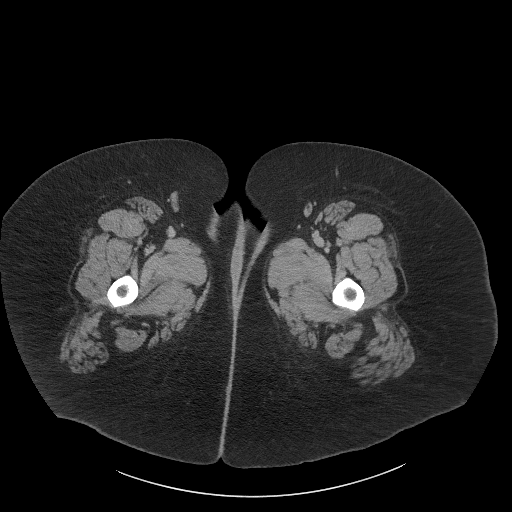
[im 5/101  bone]
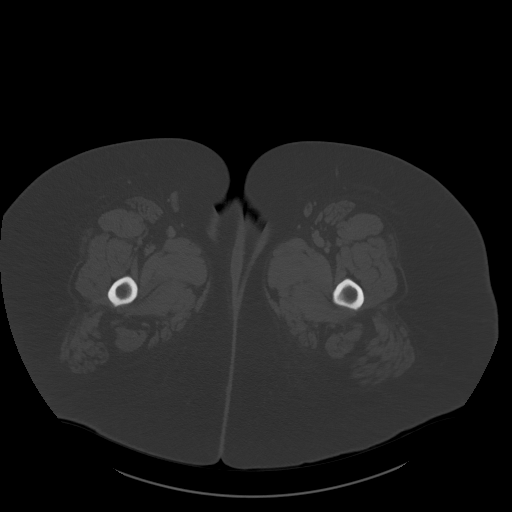
[im 13/101  soft-tissue]
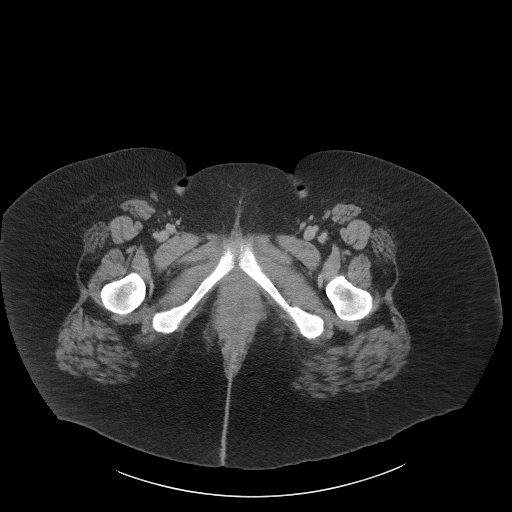
[im 21/101  soft-tissue]
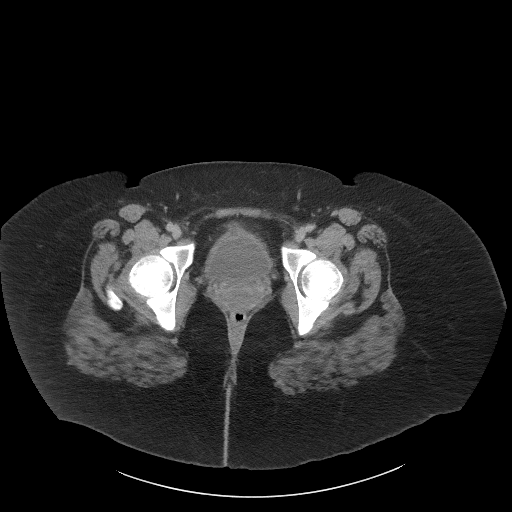
[im 29/101  soft-tissue]
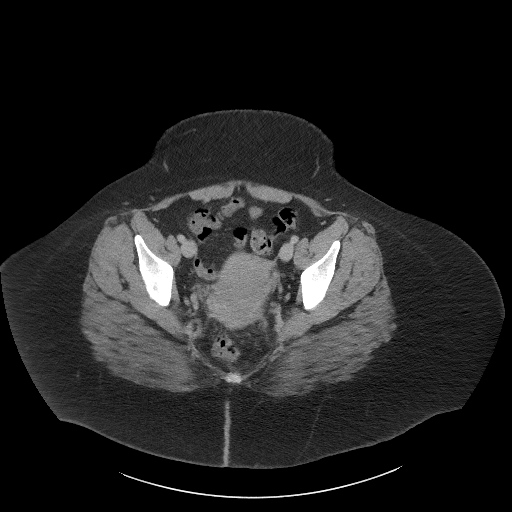
[im 37/101  soft-tissue]
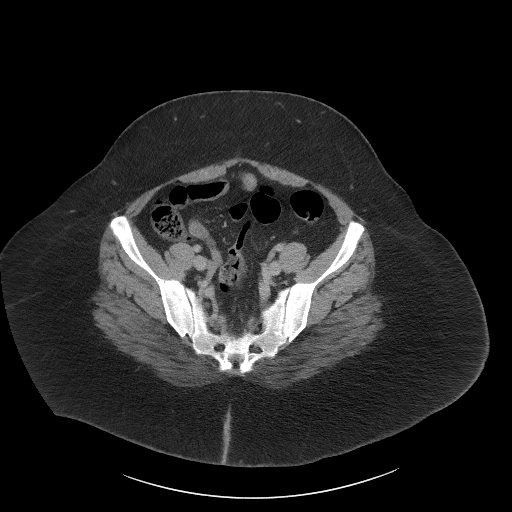
[im 45/101  soft-tissue]
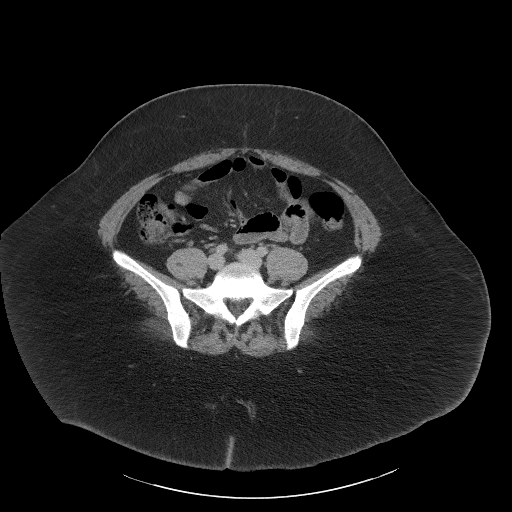
[im 53/101  soft-tissue]
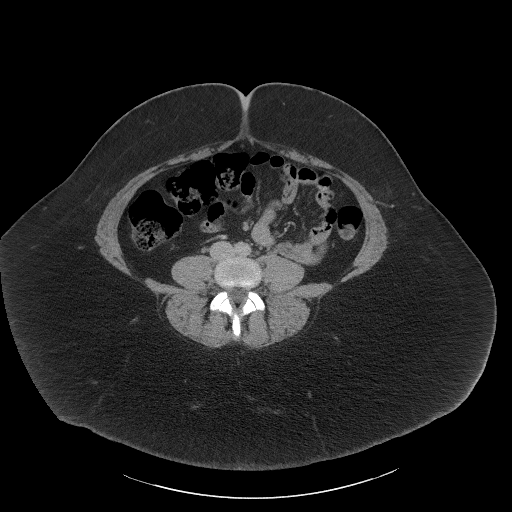
[im 57/101  soft-tissue]
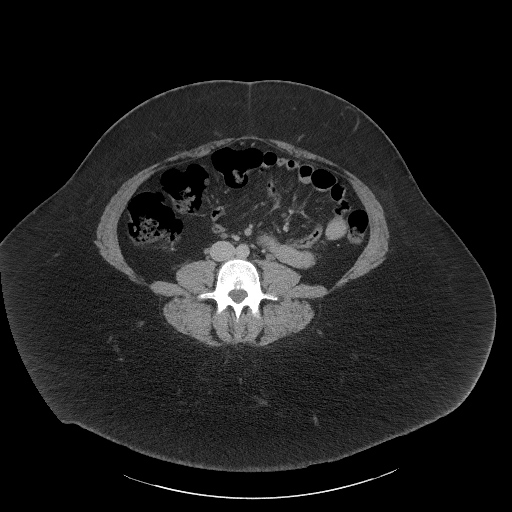
[im 65/101  soft-tissue]
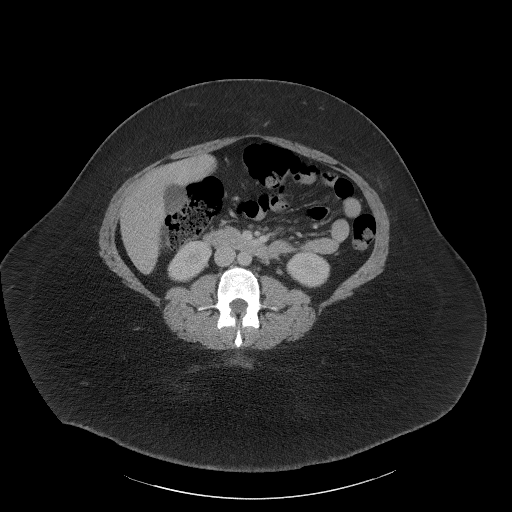
[im 65/101  bone]
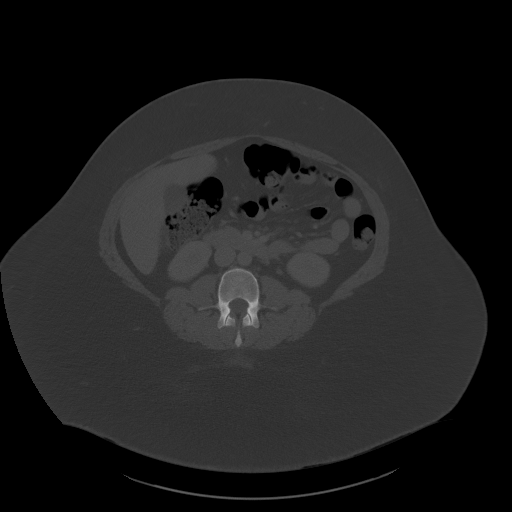
[im 73/101  soft-tissue]
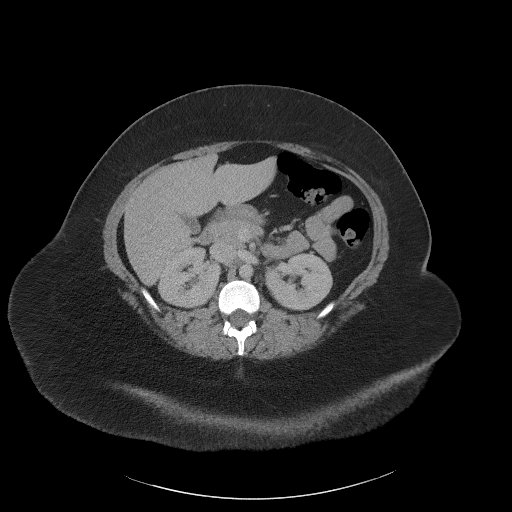
[im 81/101  soft-tissue]
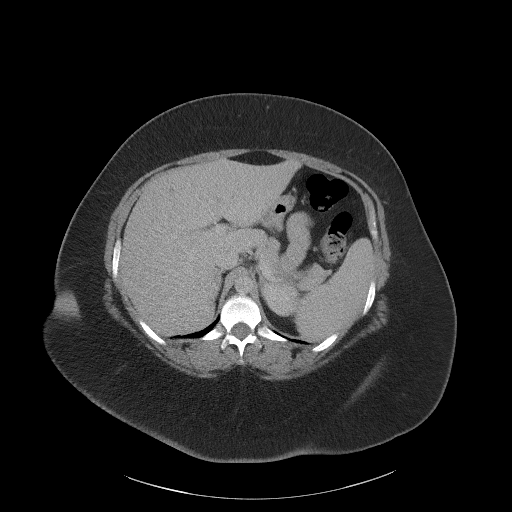
[im 89/101  soft-tissue]
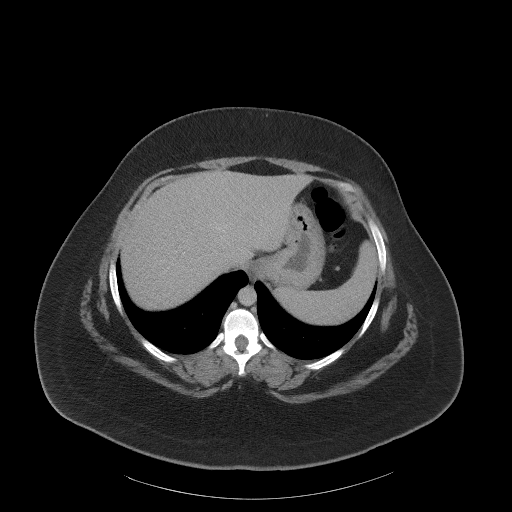
[im 97/101  soft-tissue]
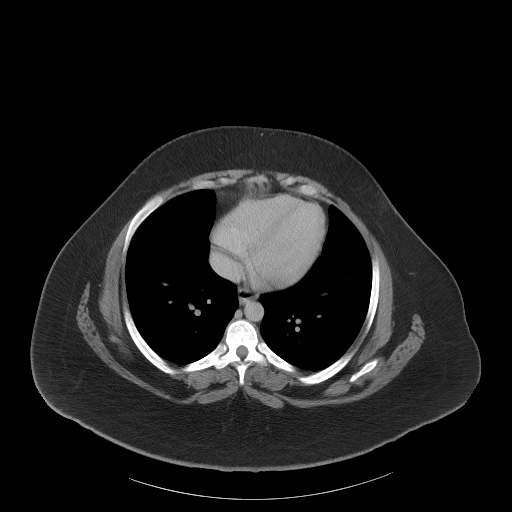

[Series 5: coronal st · coronal · 0.93mm/px · 3 of 125 slices shown]
[im 42/125  soft-tissue]
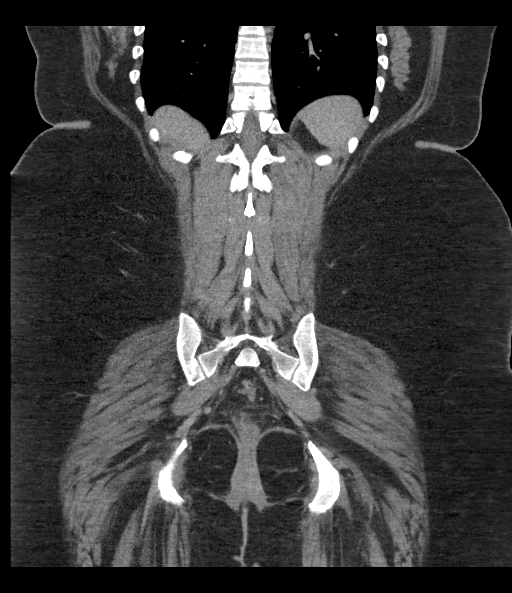
[im 56/125  soft-tissue]
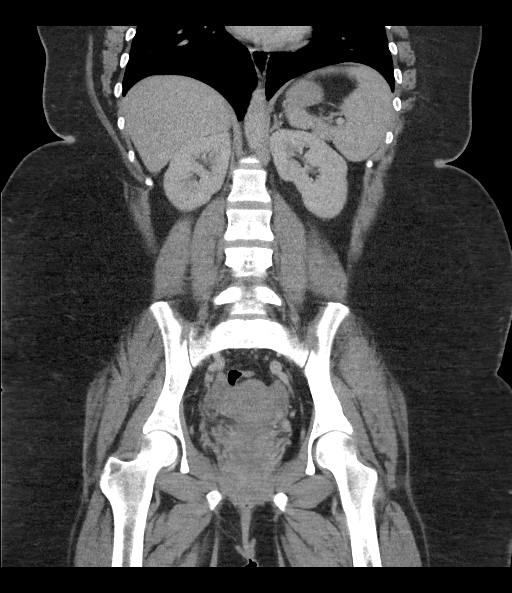
[im 69/125  soft-tissue]
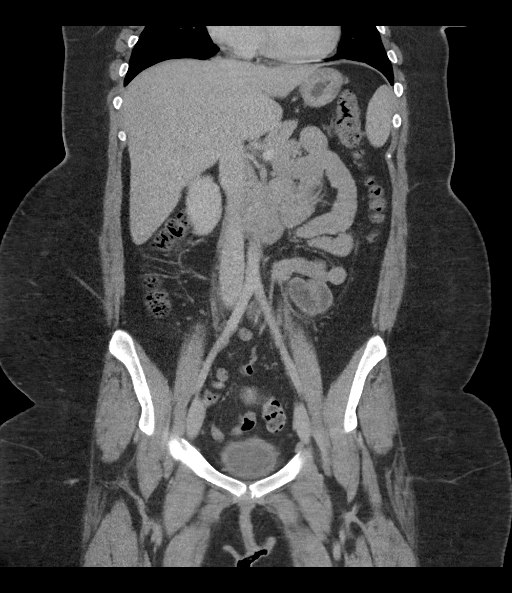

[16 of 46 positions shown; findings below may reference images not displayed]

FINDINGS: Lower chest: Lung bases are clear.

Hepatobiliary: 16 mm subcapsular hypoenhancing lesion in the lateral
segment of the LEFT hepatic lobe (image 10/series 2). Lesion not
clearly defined on remote CT. Small ill-defined lesion in the LEFT
lateral hepatic lobe on image [DATE]. Gallbladder normal.

Pancreas: Pancreas is normal. No ductal dilatation. No pancreatic
inflammation.

Spleen: Normal spleen

Adrenals/urinary tract: Adrenal glands and kidneys are normal. The
ureters and bladder normal.

Stomach/Bowel: Stomach, small bowel, appendix, and cecum are normal.
The colon and rectosigmoid colon are normal.

Vascular/Lymphatic: Abdominal aorta is normal caliber. No periportal
or retroperitoneal adenopathy. No pelvic adenopathy.

Reproductive: Uterus and adnexa unremarkable.

Other: No free fluid.

Musculoskeletal: No aggressive osseous lesion.
IMPRESSION: 1. Normal appendix.
2. No explanation for abdominal pain.
3. Normal gallbladder.
4. Normal ovaries and uterus.
5. No obstructive uropathy.
6. Hypoenhancing lesion in the lateral aspect of the LEFT hepatic
lobe is incompletely characterized. Lesion not present on remote CT
from 9033. Recommend MRI abdomen with and without contrast for
characterization of this probable benign lesion.

## 2023-04-10 ENCOUNTER — Emergency Department (HOSPITAL_BASED_OUTPATIENT_CLINIC_OR_DEPARTMENT_OTHER): Payer: PRIVATE HEALTH INSURANCE

## 2023-04-10 ENCOUNTER — Other Ambulatory Visit: Payer: Self-pay

## 2023-04-10 ENCOUNTER — Emergency Department (HOSPITAL_BASED_OUTPATIENT_CLINIC_OR_DEPARTMENT_OTHER)
Admission: EM | Admit: 2023-04-10 | Discharge: 2023-04-10 | Disposition: A | Discharge status: Home / Self Care | Payer: PRIVATE HEALTH INSURANCE | Attending: Emergency Medicine | Admitting: Emergency Medicine

## 2023-04-10 ENCOUNTER — Encounter (HOSPITAL_BASED_OUTPATIENT_CLINIC_OR_DEPARTMENT_OTHER): Payer: Self-pay

## 2023-04-10 DIAGNOSIS — R109 Unspecified abdominal pain: Secondary | ICD-10-CM | POA: Diagnosis present

## 2023-04-10 DIAGNOSIS — K769 Liver disease, unspecified: Secondary | ICD-10-CM | POA: Insufficient documentation

## 2023-04-10 DIAGNOSIS — N12 Tubulo-interstitial nephritis, not specified as acute or chronic: Secondary | ICD-10-CM | POA: Diagnosis not present

## 2023-04-10 LAB — BASIC METABOLIC PANEL
Anion gap: 8 (ref 5–15)
BUN: 10 mg/dL (ref 6–20)
CO2: 25 mmol/L (ref 22–32)
Calcium: 8.6 mg/dL — ABNORMAL LOW (ref 8.9–10.3)
Chloride: 103 mmol/L (ref 98–111)
Creatinine, Ser: 0.75 mg/dL (ref 0.44–1.00)
GFR, Estimated: >60 mL/min (ref >60)
Glucose, Bld: 125 mg/dL — ABNORMAL HIGH (ref 70–99)
Potassium: 4.5 mmol/L (ref 3.5–5.1)
Sodium: 136 mmol/L (ref 135–145)

## 2023-04-10 LAB — CBC WITH DIFFERENTIAL/PLATELET
Abs Immature Granulocytes: 0.04 10*3/uL (ref 0.00–0.07)
Basophils Absolute: 0 10*3/uL (ref 0.0–0.1)
Basophils Relative: 0 %
Eosinophils Absolute: 0.2 10*3/uL (ref 0.0–0.5)
Eosinophils Relative: 2 %
HCT: 39.8 % (ref 36.0–46.0)
Hemoglobin: 11.7 g/dL — ABNORMAL LOW (ref 12.0–15.0)
Immature Granulocytes: 0 %
Lymphocytes Relative: 37 %
Lymphs Abs: 3.8 10*3/uL (ref 0.7–4.0)
MCH: 23.4 pg — ABNORMAL LOW (ref 26.0–34.0)
MCHC: 29.4 g/dL — ABNORMAL LOW (ref 30.0–36.0)
MCV: 79.8 fL — ABNORMAL LOW (ref 80.0–100.0)
Monocytes Absolute: 0.9 10*3/uL (ref 0.1–1.0)
Monocytes Relative: 9 %
Neutro Abs: 5.4 10*3/uL (ref 1.7–7.7)
Neutrophils Relative %: 52 %
Platelets: 241 10*3/uL (ref 150–400)
RBC: 4.99 MIL/uL (ref 3.87–5.11)
RDW: 16.6 % — ABNORMAL HIGH (ref 11.5–15.5)
WBC: 10.3 10*3/uL (ref 4.0–10.5)
nRBC: 0 % (ref 0.0–0.2)

## 2023-04-10 LAB — URINALYSIS, MICROSCOPIC (REFLEX)

## 2023-04-10 LAB — URINALYSIS, ROUTINE W REFLEX MICROSCOPIC
Bilirubin Urine: NEGATIVE
Glucose, UA: >=500 mg/dL — AB
Hgb urine dipstick: NEGATIVE
Ketones, ur: NEGATIVE mg/dL
Nitrite: NEGATIVE
Protein, ur: NEGATIVE mg/dL
Specific Gravity, Urine: 1.02 (ref 1.005–1.030)
pH: 8 (ref 5.0–8.0)

## 2023-04-10 LAB — PREGNANCY, URINE: Preg Test, Ur: NEGATIVE

## 2023-04-10 LAB — WET PREP, GENITAL
Clue Cells Wet Prep HPF POC: NONE SEEN
Sperm: NONE SEEN
Trich, Wet Prep: NONE SEEN
WBC, Wet Prep HPF POC: <10 (ref <10)
Yeast Wet Prep HPF POC: NONE SEEN

## 2023-04-10 MED ORDER — SODIUM CHLORIDE 0.9 % IV BOLUS
1000.0000 mL | Freq: Once | INTRAVENOUS | Status: AC
  Administered 2023-04-10: 1000 mL via INTRAVENOUS

## 2023-04-10 MED ORDER — CEFPODOXIME PROXETIL 200 MG PO TABS: 200.0000 mg | ORAL_TABLET | Freq: Two times a day (BID) | ORAL | 0 refills | Status: AC

## 2023-04-10 MED ORDER — SODIUM CHLORIDE 0.9 % IV SOLN
1.0000 g | Freq: Once | INTRAVENOUS | Status: AC
  Administered 2023-04-10: 1 g via INTRAVENOUS
  Filled 2023-04-10: qty 10

## 2023-04-10 MED ORDER — ONDANSETRON HCL 4 MG/2ML IJ SOLN
4.0000 mg | Freq: Once | INTRAMUSCULAR | Status: AC
  Administered 2023-04-10: 4 mg via INTRAVENOUS
  Filled 2023-04-10: qty 2

## 2023-04-10 MED ORDER — CEFPODOXIME PROXETIL 200 MG PO TABS: 200.0000 mg | ORAL_TABLET | Freq: Two times a day (BID) | ORAL | 0 refills | Status: DC

## 2023-04-10 MED ORDER — FENTANYL CITRATE PF 50 MCG/ML IJ SOSY
50.0000 ug | PREFILLED_SYRINGE | Freq: Once | INTRAMUSCULAR | Status: AC
  Administered 2023-04-10: 50 ug via INTRAVENOUS
  Filled 2023-04-10: qty 1

## 2023-04-10 MED ORDER — IOHEXOL 300 MG/ML  SOLN
100.0000 mL | Freq: Once | INTRAMUSCULAR | Status: AC | PRN
  Administered 2023-04-10: 100 mL via INTRAVENOUS

## 2023-04-10 MED ORDER — FENTANYL CITRATE PF 50 MCG/ML IJ SOSY
50.0000 ug | PREFILLED_SYRINGE | Freq: Once | INTRAMUSCULAR | Status: AC
Start: 2023-04-10 — End: 2023-04-10
  Administered 2023-04-10: 50 ug via INTRAVENOUS
  Filled 2023-04-10: qty 1

## 2023-04-10 NOTE — ED Provider Notes (Signed)
 [facility] Provider Note   CSN: [id number] Arrival date & time: 04/10/23  4098     History  Chief Complaint  Patient presents with   Flank Pain   Urinary Frequency    Vicki Mays is a 33 y.o. female.   Flank Pain Associated symptoms include abdominal pain.  Urinary Frequency Associated symptoms include abdominal pain.     33 year old female with medical history significant for PE, previous episodes of BV presenting to the emergency department with concern for UTI.  The patient states that she has had 7 days of initial burning while urinating as well as urinary frequency.  She is now experiencing right-sided flank pain and associated nausea.  She endorses some mild right lower quadrant abdominal pain.  She was seen via video visit and prescribed Macrobid empirically given her symptoms.  She states her dysuria has improved somewhat but she still is having frequency.  No vaginal bleeding or abnormal vaginal discharge.  She is endorsing some vaginal irritation and questions whether she is developing a yeast infection.  She denies any fevers or chills.  She denies any vomiting.  She is not concerned for sexually transmitted infection because she tested negative in December and has not had sexual intercourse since then.  Home Medications Prior to Admission medications   Medication Sig Start Date End Date Taking? Authorizing Provider  cefpodoxime (VANTIN) 200 MG tablet Take 1 tablet (200 mg total) by mouth 2 (two) times daily for 10 days. 04/10/23 04/20/23 Yes Ernie Avena, MD  metroNIDAZOLE (FLAGYL) 500 MG tablet Take 1 tablet (500 mg total) by mouth 2 (two) times daily. 01/29/23   Roemhildt, Lorin T, PA-C  morphine (MSIR) 15 MG tablet Take 0.5 tablets (7.5 mg total) by mouth every 4 (four) hours as needed for severe pain. 08/01/21   Melene Plan, DO      Allergies    Amoxicillin    Review of Systems   Review of Systems   Gastrointestinal:  Positive for abdominal pain and nausea.  Genitourinary:  Positive for flank pain and frequency.  All other systems reviewed and are negative.   Physical Exam Updated Vital Signs BP 128/86 (BP Location: Right Arm)   Pulse 73   Temp 98.3 F (36.8 C) (Oral)   Resp 16   Ht 5\' 2"  (1.575 m)   Wt (!) 140.6 kg   LMP 03/24/2023 (Exact Date)   SpO2 100%   BMI 56.70 kg/m  Physical Exam Vitals and nursing note reviewed.  Constitutional:      General: She is not in acute distress.    Appearance: She is well-developed. She is obese.  HENT:     Head: Normocephalic and atraumatic.  Eyes:     Conjunctiva/sclera: Conjunctivae normal.  Cardiovascular:     Rate and Rhythm: Normal rate and regular rhythm.     Heart sounds: No murmur heard. Pulmonary:     Effort: Pulmonary effort is normal. No respiratory distress.     Breath sounds: Normal breath sounds.  Abdominal:     Palpations: Abdomen is soft.     Tenderness: There is abdominal tenderness in the right lower quadrant. There is right CVA tenderness. There is no guarding.  Musculoskeletal:        General: No swelling.     Cervical back: Neck supple.  Skin:    General: Skin is warm and dry.     Capillary Refill: Capillary refill takes less than 2 seconds.  Neurological:  Mental Status: She is alert.  Psychiatric:        Mood and Affect: Mood normal.     ED Results / Procedures / Treatments   Labs (all labs ordered are listed, but only abnormal results are displayed) Labs Reviewed  URINALYSIS, ROUTINE W REFLEX MICROSCOPIC - Abnormal; Notable for the following components:      Result Value   Glucose, UA >=500 (*)    Leukocytes,Ua TRACE (*)    All other components within normal limits  CBC WITH DIFFERENTIAL/PLATELET - Abnormal; Notable for the following components:   Hemoglobin 11.7 (*)    MCV 79.8 (*)    MCH 23.4 (*)    MCHC 29.4 (*)    RDW 16.6 (*)    All other components within normal limits  BASIC  METABOLIC PANEL - Abnormal; Notable for the following components:   Glucose, Bld 125 (*)    Calcium 8.6 (*)    All other components within normal limits  URINALYSIS, MICROSCOPIC (REFLEX) - Abnormal; Notable for the following components:   Bacteria, UA RARE (*)    All other components within normal limits  WET PREP, GENITAL  URINE CULTURE  PREGNANCY, URINE    EKG None  Radiology CT ABDOMEN PELVIS W CONTRAST Result Date: 04/10/2023 CLINICAL DATA:  Right lower quadrant and flank pain. Dysuria and urinary frequency for 1 week. Partial relief of symptoms following Macrobid course. EXAM: CT ABDOMEN AND PELVIS WITH CONTRAST TECHNIQUE: Multidetector CT imaging of the abdomen and pelvis was performed using the standard protocol following bolus administration of intravenous contrast. RADIATION DOSE REDUCTION: This exam was performed according to the departmental dose-optimization program which includes automated exposure control, adjustment of the mA and/or kV according to patient size and/or use of iterative reconstruction technique. CONTRAST:  OMNIPAQUE IOHEXOL 300 MG/ML  SOLN COMPARISON:  Abdominopelvic CT 01/27/2023 and 08/24/2020 FINDINGS: Lower chest: Clear lung bases. No significant pleural or pericardial effusion. Hepatobiliary: Multiple liver lesions are grossly unchanged compared with the most recent CT. These include a hypervascular 2.5 cm lesion in the left lobe on image 15/301 and a 2.3 cm lesion in segment 2, also enhancing previously. These are probably hemangiomas. Other smaller nonspecific lesions are grossly unchanged. No evidence of gallstones, gallbladder wall thickening or biliary dilatation. Pancreas: Unremarkable. No pancreatic ductal dilatation or surrounding inflammatory changes. Spleen: Normal in size without focal abnormality. Adrenals/Urinary Tract: Both adrenal glands appear normal. No evidence of urinary tract calculus, suspicious renal lesion or hydronephrosis. The bladder  appears unremarkable for its degree of distention. Stomach/Bowel: No enteric contrast administered. The stomach appears unremarkable for its degree of distension. No evidence of bowel wall thickening, distention or surrounding inflammatory change. The appendix appears normal. Mildly prominent stool throughout the colon. Vascular/Lymphatic: There are no enlarged abdominal or pelvic lymph nodes. No significant vascular findings. Reproductive: The uterus and ovaries appear unremarkable. No adnexal mass. Other: No evidence of abdominal wall mass or hernia. No ascites or pneumoperitoneum. Musculoskeletal: No acute or significant osseous findings. IMPRESSION: 1. No acute findings or explanation for the patient's symptoms. The appendix appears normal. 2. Mildly prominent stool throughout the colon suggesting constipation. 3. Multiple liver lesions are grossly unchanged compared with the most recent CT, probably hemangiomas. These lesions could be more definitively characterized by nonemergent MRI, as previously suggested. Electronically Signed   By: Carey Bullocks M.D.   On: 04/10/2023 13:00    Procedures Procedures    Medications Ordered in ED Medications  sodium chloride 0.9 % bolus  1,000 mL (0 mLs Intravenous Stopped 04/10/23 1136)  cefTRIAXone (ROCEPHIN) 1 g in sodium chloride 0.9 % 100 mL IVPB (0 g Intravenous Stopped 04/10/23 1106)  ondansetron (ZOFRAN) injection 4 mg (4 mg Intravenous Given 04/10/23 1012)  fentaNYL (SUBLIMAZE) injection 50 mcg (50 mcg Intravenous Given 04/10/23 1016)  iohexol (OMNIPAQUE) 300 MG/ML solution 100 mL (100 mLs Intravenous Contrast Given 04/10/23 1034)  fentaNYL (SUBLIMAZE) injection 50 mcg (50 mcg Intravenous Given 04/10/23 1114)    ED Course/ Medical Decision Making/ A&P Clinical Course as of 04/10/23 1318  Tue Apr 10, 2023  0910 Bacteria, UA(!): RARE [JL]  0910 WBC, UA: 6-10 [JL]    Clinical Course User Index [JL] Ernie Avena, MD                                  Medical Decision Making Amount and/or Complexity of Data Reviewed Labs: ordered. Decision-making details documented in ED Course. Radiology: ordered.  Risk Prescription drug management.    33 year old female with medical history significant for PE, previous episodes of BV presenting to the emergency department with concern for UTI.  The patient states that she has had 7 days of initial burning while urinating as well as urinary frequency.  She is now experiencing right-sided flank pain and associated nausea.  She endorses some mild right lower quadrant abdominal pain.  She was seen via video visit and prescribed Macrobid empirically given her symptoms.  She states her dysuria has improved somewhat but she still is having frequency.  No vaginal bleeding or abnormal vaginal discharge.  She is endorsing some vaginal irritation and questions whether she is developing a yeast infection.  She denies any fevers or chills.  She denies any vomiting.  She is not concerned for sexually transmitted infection because she tested negative in December and has not had sexual intercourse since then.  Medical Decision Making:   Maelin Kurkowski is a 33 y.o. female who presented to the ED today with abdominal pain, detailed above.     Complete initial physical exam performed, notably the patient  was TTP in the RLQ and right flank.     Reviewed and confirmed nursing documentation for past medical history, family history, social history.    Initial Assessment:   With the patient's presentation of abdominal pain, most likely diagnosis is UTI/developing pyelonephritis. Considered appendicitis. Other diagnoses were considered including (but not limited to) gastroenteritis, colitis, diverticulitis, ruptured ectopic pregnancy, PID, ovarian torsion. Pt unconcerned for STI, low concern for PID. Pt concern for possible yeast vaginitis, but declines pelvic exam, wishes to self swab for BV/yeast.    Initial Plan:  CBC/CMP  to evaluate for underlying infectious/metabolic etiology for patient's abdominal pain  Self swab wet prep CTAB/Pelvis with contrast to evaluate for structural/surgical etiology of patients' severe abdominal pain.  Urine pregnancy Urinalysis and repeat physical assessment to evaluate for UTI/Pyelonpehritis  Empiric management of symptoms with escalating pain control and antiemetics as needed.   Initial Study Results:   Laboratory  All laboratory results reviewed without evidence of clinically relevant pathology.   Exceptions include: Urinalysis with rare bacteria, 6-10 WBCs, trace leukocytes, CBC with a mild anemia at 11.7, wet prep negative.   Radiology All images reviewed independently. Agree with radiology report at this time.   CT ABDOMEN PELVIS W CONTRAST Result Date: 04/10/2023 CLINICAL DATA:  Right lower quadrant and flank pain. Dysuria and urinary frequency for 1 week. Partial relief  of symptoms following Macrobid course. EXAM: CT ABDOMEN AND PELVIS WITH CONTRAST TECHNIQUE: Multidetector CT imaging of the abdomen and pelvis was performed using the standard protocol following bolus administration of intravenous contrast. RADIATION DOSE REDUCTION: This exam was performed according to the departmental dose-optimization program which includes automated exposure control, adjustment of the mA and/or kV according to patient size and/or use of iterative reconstruction technique. CONTRAST:  OMNIPAQUE IOHEXOL 300 MG/ML  SOLN COMPARISON:  Abdominopelvic CT 01/27/2023 and 08/24/2020 FINDINGS: Lower chest: Clear lung bases. No significant pleural or pericardial effusion. Hepatobiliary: Multiple liver lesions are grossly unchanged compared with the most recent CT. These include a hypervascular 2.5 cm lesion in the left lobe on image 15/301 and a 2.3 cm lesion in segment 2, also enhancing previously. These are probably hemangiomas. Other smaller nonspecific lesions are grossly unchanged. No evidence of  gallstones, gallbladder wall thickening or biliary dilatation. Pancreas: Unremarkable. No pancreatic ductal dilatation or surrounding inflammatory changes. Spleen: Normal in size without focal abnormality. Adrenals/Urinary Tract: Both adrenal glands appear normal. No evidence of urinary tract calculus, suspicious renal lesion or hydronephrosis. The bladder appears unremarkable for its degree of distention. Stomach/Bowel: No enteric contrast administered. The stomach appears unremarkable for its degree of distension. No evidence of bowel wall thickening, distention or surrounding inflammatory change. The appendix appears normal. Mildly prominent stool throughout the colon. Vascular/Lymphatic: There are no enlarged abdominal or pelvic lymph nodes. No significant vascular findings. Reproductive: The uterus and ovaries appear unremarkable. No adnexal mass. Other: No evidence of abdominal wall mass or hernia. No ascites or pneumoperitoneum. Musculoskeletal: No acute or significant osseous findings. IMPRESSION: 1. No acute findings or explanation for the patient's symptoms. The appendix appears normal. 2. Mildly prominent stool throughout the colon suggesting constipation. 3. Multiple liver lesions are grossly unchanged compared with the most recent CT, probably hemangiomas. These lesions could be more definitively characterized by nonemergent MRI, as previously suggested. Electronically Signed   By: Carey Bullocks M.D.   On: 04/10/2023 13:00      Final Reassessment and Plan:   Patient overall well-appearing, tolerating oral intake, will discharge with a prescription for Vantin for early pyelonephritis.  Patient informed of CT results, stable for discharge.  Final Clinical Impression(s) / ED Diagnoses Final diagnoses:  Pyelonephritis  Liver lesion    Rx / DC Orders ED Discharge Orders          Ordered    cefpodoxime (VANTIN) 200 MG tablet  2 times daily        04/10/23 1317               Ernie Avena, MD 04/10/23 1318

## 2023-04-10 NOTE — ED Notes (Signed)

## 2023-04-10 NOTE — ED Notes (Signed)
 Pt expressed her brother is coming and she wants privacy for medical discussion with any providers. She will ask him to step out into the hallway whenever necessary. She is still okay with him being in the room otherwise.

## 2023-04-10 NOTE — ED Triage Notes (Signed)
 States has had burning with urination and urinary frequency x 1 week, did a virtual visit, gave macrobid. Completed course which improved burning however complains of continued urinary frequency and vaginal itching. Denies discharge. States malodorous urine. Denies abdominal pain, has some lower right flank pain.

## 2023-04-10 NOTE — Discharge Instructions (Addendum)
 Your symptoms are consistent with UTI and developing pyelonephritis which is a kidney infection.  We will treat you with a 10-day course of antibiotics.  Your CT imaging was otherwise unremarkable with no evidence of appendicitis, did show some evidence of stool burden consistent with mild constipation.  IMPRESSION:  1. No acute findings or explanation for the patient's symptoms. The  appendix appears normal.  2. Mildly prominent stool throughout the colon suggesting  constipation.  3. Multiple liver lesions are grossly unchanged compared with the  most recent CT, probably hemangiomas. These lesions could be more  definitively characterized by nonemergent MRI, as previously  suggested.

## 2023-04-12 LAB — URINE CULTURE: Culture: >=100000 — AB
